# Patient Record
Sex: Female | Born: 1980 | Race: White | Hispanic: No | Marital: Married | Smoking: Former smoker
Health system: Southern US, Community
[De-identification: ages and names within clinical notes are randomized; demographics above are authoritative.]

## PROBLEM LIST (undated history)

## (undated) DIAGNOSIS — A159 Respiratory tuberculosis unspecified: Secondary | ICD-10-CM

## (undated) DIAGNOSIS — N7011 Chronic salpingitis: Secondary | ICD-10-CM

## (undated) DIAGNOSIS — Z8669 Personal history of other diseases of the nervous system and sense organs: Secondary | ICD-10-CM

## (undated) DIAGNOSIS — K5190 Ulcerative colitis, unspecified, without complications: Secondary | ICD-10-CM

## (undated) DIAGNOSIS — IMO0002 Reserved for concepts with insufficient information to code with codable children: Secondary | ICD-10-CM

## (undated) DIAGNOSIS — R519 Headache, unspecified: Secondary | ICD-10-CM

## (undated) DIAGNOSIS — E109 Type 1 diabetes mellitus without complications: Secondary | ICD-10-CM

## (undated) DIAGNOSIS — R87619 Unspecified abnormal cytological findings in specimens from cervix uteri: Secondary | ICD-10-CM

## (undated) HISTORY — DX: Ulcerative colitis, unspecified, without complications: K51.90

## (undated) HISTORY — DX: Headache, unspecified: R51.9

## (undated) HISTORY — PX: WISDOM TOOTH EXTRACTION: SHX21

## (undated) HISTORY — DX: Unspecified abnormal cytological findings in specimens from cervix uteri: R87.619

## (undated) HISTORY — DX: Personal history of other diseases of the nervous system and sense organs: Z86.69

## (undated) HISTORY — DX: Reserved for concepts with insufficient information to code with codable children: IMO0002

---
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
## 2009-09-05 ENCOUNTER — Other Ambulatory Visit: Admission: RE | Admit: 2009-09-05 | Discharge: 2009-09-05 | Payer: Self-pay | Admitting: Internal Medicine

## 2010-09-14 ENCOUNTER — Other Ambulatory Visit (HOSPITAL_COMMUNITY)
Admission: RE | Admit: 2010-09-14 | Discharge: 2010-09-14 | Disposition: A | Discharge status: Home / Self Care | Payer: BC Managed Care – PPO | Source: Ambulatory Visit | Attending: Internal Medicine | Admitting: Internal Medicine

## 2010-09-14 DIAGNOSIS — Z01419 Encounter for gynecological examination (general) (routine) without abnormal findings: Secondary | ICD-10-CM | POA: Insufficient documentation

## 2011-09-14 ENCOUNTER — Other Ambulatory Visit (HOSPITAL_COMMUNITY)
Admission: RE | Admit: 2011-09-14 | Discharge: 2011-09-14 | Disposition: A | Discharge status: Home / Self Care | Payer: BC Managed Care – PPO | Source: Ambulatory Visit | Attending: Internal Medicine | Admitting: Internal Medicine

## 2011-09-14 DIAGNOSIS — Z01419 Encounter for gynecological examination (general) (routine) without abnormal findings: Secondary | ICD-10-CM | POA: Insufficient documentation

## 2011-10-30 ENCOUNTER — Telehealth: Payer: Self-pay | Admitting: Obstetrics and Gynecology

## 2011-10-30 ENCOUNTER — Ambulatory Visit (INDEPENDENT_AMBULATORY_CARE_PROVIDER_SITE_OTHER): Payer: BC Managed Care – PPO | Admitting: Obstetrics and Gynecology

## 2011-10-30 ENCOUNTER — Encounter: Payer: Self-pay | Admitting: Obstetrics and Gynecology

## 2011-10-30 VITALS — BP 92/60 | HR 68 | Wt 133.0 lb

## 2011-10-30 DIAGNOSIS — Z309 Encounter for contraceptive management, unspecified: Secondary | ICD-10-CM

## 2011-10-30 DIAGNOSIS — N926 Irregular menstruation, unspecified: Secondary | ICD-10-CM

## 2011-10-30 DIAGNOSIS — N898 Other specified noninflammatory disorders of vagina: Secondary | ICD-10-CM

## 2011-10-30 LAB — POCT WET PREP (WET MOUNT)
Trichomonas Wet Prep HPF POC: NEGATIVE
pH: 4.5

## 2011-10-30 LAB — POCT URINE PREGNANCY: Preg Test, Ur: NEGATIVE

## 2011-10-30 MED ORDER — ETONOGESTREL-ETHINYL ESTRADIOL 0.12-0.015 MG/24HR VA RING: VAGINAL_RING | VAGINAL | Status: DC

## 2011-10-30 NOTE — Progress Notes (Signed)
Vaginal discharge: brownthick mucoid Itching / Burning: no Fever: no  Symptoms have been present for 2 weeks. Has used over-the-counter treatment: no Associated symptoms:  Pelvic pain: no       Dyspareunia: no     Odor:  no  History of STD:  no history of PID, STD's STD screen:declined

## 2011-10-30 NOTE — Progress Notes (Signed)
30 YO complains of a 2 week vaginal discharge that is "dark" since her last period.  Has not missed/late pills, no recent antibiotics or vomiting/diarrhea.  With period, flow x 3 days with tampon change twice a day.  Cramps 6/10 but doesn't require analgesia.  Discharge does require a protection. Has always had irreglar bleeding with oral contraceptives-even the previous pills that allowed a period each month.  (has been on Zovia, kariva   O: Pelvic: EGBUS-wnl;   vagina-beige discharge; cervix-no lesions/anterior, uterus-normal size and no tenderness, adnexae-no masses or tenderness  Wet prep-negative UPT-negative  A: Unscheduled bleeding on BCPS   P: Patient given option of a higher dose pill, Nuva Ring/Patch, or pelvic ultrasound with TSH evaluation.      She will try Nuva Ring for now-reviewed and demonstrated use;  Brochure given with sample       RTO-as scheduled or prn  Jaiyon Wander, PA-C

## 2012-08-14 ENCOUNTER — Inpatient Hospital Stay (HOSPITAL_COMMUNITY)
Admission: AD | Admit: 2012-08-14 | Discharge: 2012-08-14 | Disposition: A | Discharge status: Home / Self Care | Payer: BC Managed Care – PPO | Source: Ambulatory Visit | Attending: Obstetrics and Gynecology | Admitting: Obstetrics and Gynecology

## 2012-08-14 ENCOUNTER — Encounter (HOSPITAL_COMMUNITY): Payer: Self-pay | Admitting: *Deleted

## 2012-08-14 ENCOUNTER — Inpatient Hospital Stay (HOSPITAL_COMMUNITY): Payer: BC Managed Care – PPO

## 2012-08-14 DIAGNOSIS — R109 Unspecified abdominal pain: Secondary | ICD-10-CM | POA: Insufficient documentation

## 2012-08-14 DIAGNOSIS — N949 Unspecified condition associated with female genital organs and menstrual cycle: Secondary | ICD-10-CM | POA: Insufficient documentation

## 2012-08-14 DIAGNOSIS — N83201 Unspecified ovarian cyst, right side: Secondary | ICD-10-CM

## 2012-08-14 DIAGNOSIS — N83209 Unspecified ovarian cyst, unspecified side: Secondary | ICD-10-CM | POA: Insufficient documentation

## 2012-08-14 DIAGNOSIS — N926 Irregular menstruation, unspecified: Secondary | ICD-10-CM | POA: Insufficient documentation

## 2012-08-14 LAB — WET PREP, GENITAL
Trich, Wet Prep: NONE SEEN
Yeast Wet Prep HPF POC: NONE SEEN

## 2012-08-14 LAB — CBC
MCV: 86.5 fL (ref 78.0–100.0)
Platelets: 250 10*3/uL (ref 150–400)
RBC: 4.36 MIL/uL (ref 3.87–5.11)
WBC: 8.6 10*3/uL (ref 4.0–10.5)

## 2012-08-14 LAB — URINALYSIS, ROUTINE W REFLEX MICROSCOPIC
Ketones, ur: NEGATIVE mg/dL
Leukocytes, UA: NEGATIVE
Nitrite: NEGATIVE
Protein, ur: NEGATIVE mg/dL
Urobilinogen, UA: 0.2 mg/dL (ref 0.0–1.0)

## 2012-08-14 MED ORDER — IBUPROFEN 800 MG PO TABS
800.0000 mg | ORAL_TABLET | Freq: Once | ORAL | Status: AC
  Administered 2012-08-14: 800 mg via ORAL
  Filled 2012-08-14: qty 1

## 2012-08-14 MED ORDER — HYDROMORPHONE HCL 2 MG PO TABS
1.0000 mg | ORAL_TABLET | Freq: Once | ORAL | Status: AC
  Administered 2012-08-14: 1 mg via ORAL
  Filled 2012-08-14: qty 1

## 2012-08-14 MED ORDER — KETOROLAC TROMETHAMINE 60 MG/2ML IM SOLN
60.0000 mg | Freq: Once | INTRAMUSCULAR | Status: DC
  Filled 2012-08-14: qty 2

## 2012-08-14 MED ORDER — OXYCODONE-ACETAMINOPHEN 2.5-325 MG PO TABS: 1.0000 | ORAL_TABLET | ORAL | Status: DC | PRN

## 2012-08-14 NOTE — MAU Note (Signed)
Pt reports she was seen in ER over the weekend due to rt lower abd, right flank pain. Dx with hemorrhagic cyst and . Now having worsening pain and low grade fever.

## 2012-08-14 NOTE — MAU Provider Note (Signed)
History     CSN: [id number]  Arrival date and time: 08/14/12 4098   None     Chief Complaint  Patient presents with  . Abdominal Pain  . Flank Pain   HPI  Tammy Pollard is a 32 y.o. G0P0 who presents today with RLQ and pelvic pain. She has also had a temp of 100.0 at home. No tylenol since last night. Worked all day with only ibuprofen for pain, but then tonight the pain became much worse. She denies any vaginal bleeding. She was seen in the ED in [facility], and was told she had a hemorrhagic cyst, and ? "some fluid on the tube".   She has ulcerative colitis and has had her large intestine and appendix removed.  Past Medical History  Diagnosis Date  . Colitis, ulcerative   . Hx of migraines   . Abnormal Pap smear     CIN-I    Past Surgical History  Procedure Laterality Date  . Colectomy  1997    ulceratrive colitis  . Wisdom tooth extraction      Family History  Problem Relation Age of Onset  . Heart attack Paternal Grandfather   . Cancer Maternal Grandfather     History  Substance Use Topics  . Smoking status: Current Some Day Smoker  . Smokeless tobacco: Not on file     Comment: < PACK A WEEK  . Alcohol Use: Yes     Comment: OCCASIONAL    Allergies:  Allergies  Allergen Reactions  . Sulfa Antibiotics Rash    Prescriptions prior to admission  Medication Sig Dispense Refill  . ibuprofen (ADVIL,MOTRIN) 600 MG tablet Take 600 mg by mouth every 6 (six) hours as needed for pain.      . Multiple Vitamin (MULTIVITAMIN WITH MINERALS) TABS Take 1 tablet by mouth daily.      . Omega-3 Fatty Acids (FISH OIL PO) Take 1 capsule by mouth daily.      . ondansetron (ZOFRAN-ODT) 4 MG disintegrating tablet Take 4 mg by mouth every 8 (eight) hours as needed for nausea.      Marland Kitchen oxycodone-acetaminophen (PERCOCET) 2.5-325 MG per tablet Take 1 tablet by mouth every 4 (four) hours as needed for pain.        Review of Systems  Constitutional: Positive for fever and chills.  Eyes:  Negative for blurred vision.  Respiratory: Negative for shortness of breath.   Cardiovascular: Negative for chest pain.  Gastrointestinal: Positive for nausea, abdominal pain and diarrhea. Negative for vomiting and constipation.  Genitourinary: Negative for dysuria, urgency and frequency.  Musculoskeletal: Negative for myalgias.  Neurological: Negative for dizziness and headaches.   Physical Exam   Blood pressure 138/95, pulse 93, temperature 99.9 F (37.7 C), temperature source Oral, resp. rate 18, height 5\' 6"  (1.676 m), weight 61.689 kg (136 lb), last menstrual period 06/01/2012, SpO2 100.00%.  Physical Exam  Nursing note and vitals reviewed. Constitutional: She is oriented to person, place, and time. She appears well-developed and well-nourished. No distress.  Cardiovascular: Normal rate.   Respiratory: Effort normal.  GI: Soft. She exhibits no distension. There is no tenderness.  Genitourinary:   External: no lesion Vagina: small amount of white discharge Cervix: pink, smooth, no CMT Uterus: AV, NSSC Adnexa, NT r larger than l.   Neurological: She is alert and oriented to person, place, and time.  Skin: Skin is warm and dry.  Psychiatric: She has a normal mood and affect.    MAU Course  Procedures  Results for orders placed during the hospital encounter of 08/14/12 (from the past 24 hour(s))  URINALYSIS, ROUTINE W REFLEX MICROSCOPIC     Status: None   Collection Time    08/14/12  6:20 AM      Result Value Range   Color, Urine YELLOW  YELLOW   APPearance CLEAR  CLEAR   Specific Gravity, Urine 1.025  1.005 - 1.030   pH 5.5  5.0 - 8.0   Glucose, UA NEGATIVE  NEGATIVE mg/dL   Hgb urine dipstick NEGATIVE  NEGATIVE   Bilirubin Urine NEGATIVE  NEGATIVE   Ketones, ur NEGATIVE  NEGATIVE mg/dL   Protein, ur NEGATIVE  NEGATIVE mg/dL   Urobilinogen, UA 0.2  0.0 - 1.0 mg/dL   Nitrite NEGATIVE  NEGATIVE   Leukocytes, UA NEGATIVE  NEGATIVE  POCT PREGNANCY, URINE     Status:  None   Collection Time    08/14/12  6:29 AM      Result Value Range   Preg Test, Ur NEGATIVE  NEGATIVE  WET PREP, GENITAL     Status: Abnormal   Collection Time    08/14/12  6:50 AM      Result Value Range   Yeast Wet Prep HPF POC NONE SEEN  NONE SEEN   Trich, Wet Prep NONE SEEN  NONE SEEN   Clue Cells Wet Prep HPF POC NONE SEEN  NONE SEEN   WBC, Wet Prep HPF POC FEW (*) NONE SEEN  CBC     Status: None   Collection Time    08/14/12  7:01 AM      Result Value Range   WBC 8.6  4.0 - 10.5 K/uL   RBC 4.36  3.87 - 5.11 MIL/uL   Hemoglobin 13.0  12.0 - 15.0 g/dL   HCT 40.9  81.1 - 91.4 %   MCV 86.5  78.0 - 100.0 fL   MCH 29.8  26.0 - 34.0 pg   MCHC 34.5  30.0 - 36.0 g/dL   RDW 78.2  95.6 - 21.3 %   Platelets 250  150 - 400 K/uL    Assessment and Plan  0800 Care turned over to [facility], CNM  Tawnya Crook 08/14/2012, 7:50 AM   US Transvaginal Non-ob  08/14/2012   *RADIOLOGY REPORT*  Clinical Data: Hemorrhagic cyst with more pain and fever today. LMP 06/01/2012  TRANSABDOMINAL AND TRANSVAGINAL ULTRASOUND OF PELVIS Technique:  Both transabdominal and transvaginal ultrasound examinations of the pelvis were performed. Transabdominal technique was performed for global imaging of the pelvis including uterus, ovaries, adnexal regions, and pelvic cul-de-sac.  It was necessary to proceed with endovaginal exam following the transabdominal exam to visualize the myometrium, endometrium and adnexa.  Comparison:  None  Findings:  Uterus: Is retroverted and retroflexed and demonstrates a sagittal length of 6.9 cm, depth of 3.2 cm and width of 4.8 cm.  A homogeneous myometrium is seen  Endometrium: Is thin and echogenic with a width of 3 mm.  No areas of focal thickening or heterogeneity are noted  Right ovary:  Measures 4.2 x 3.0 x 3.7 cm and contains a complex cystic lesion which is avascular measuring 3.5 x 2.6 x 3.2 cm. This has an appearance sonographically which is most suspicious  for a hemorrhagic cyst.  Left ovary: Has a normal appearance measuring 2.7 x 1.3 x 1.9 cm  Other findings: A trace of simple free fluid is noted in the cul-de- sac.  No separate adnexal masses are seen  IMPRESSION:  Complex right ovarian mass with sonographic features most suspicious for a hemorrhagic cyst. Given the history of fever, although rare, in the appropriate clinical setting an isolated intraovarian abscess can have a similar appearance. This is felt less likely given the lack of substantial perilesional flow in the normal surrounding ovarian stroma. Follow-up can be performed to the immediate postsecretory phase of the cycle following the next complete cycle to assess for expected resolution/ evolution of a hemorrhagic cyst.   Original Report Authenticated By: Rhodia Albright, M.D.   US Pelvis Complete  08/14/2012   *RADIOLOGY REPORT*  Clinical Data: Hemorrhagic cyst with more pain and fever today. LMP 06/01/2012  TRANSABDOMINAL AND TRANSVAGINAL ULTRASOUND OF PELVIS Technique:  Both transabdominal and transvaginal ultrasound examinations of the pelvis were performed. Transabdominal technique was performed for global imaging of the pelvis including uterus, ovaries, adnexal regions, and pelvic cul-de-sac.  It was necessary to proceed with endovaginal exam following the transabdominal exam to visualize the myometrium, endometrium and adnexa.  Comparison:  None  Findings:  Uterus: Is retroverted and retroflexed and demonstrates a sagittal length of 6.9 cm, depth of 3.2 cm and width of 4.8 cm.  A homogeneous myometrium is seen  Endometrium: Is thin and echogenic with a width of 3 mm.  No areas of focal thickening or heterogeneity are noted  Right ovary:  Measures 4.2 x 3.0 x 3.7 cm and contains a complex cystic lesion which is avascular measuring 3.5 x 2.6 x 3.2 cm. This has an appearance sonographically which is most suspicious for a hemorrhagic cyst.  Left ovary: Has a normal appearance measuring 2.7 x 1.3  x 1.9 cm  Other findings: A trace of simple free fluid is noted in the cul-de- sac.  No separate adnexal masses are seen  IMPRESSION: Complex right ovarian mass with sonographic features most suspicious for a hemorrhagic cyst. Given the history of fever, although rare, in the appropriate clinical setting an isolated intraovarian abscess can have a similar appearance. This is felt less likely given the lack of substantial perilesional flow in the normal surrounding ovarian stroma. Follow-up can be performed to the immediate postsecretory phase of the cycle following the next complete cycle to assess for expected resolution/ evolution of a hemorrhagic cyst.   Original Report Authenticated By: Rhodia Albright, M.D.   A/P:  1. Ovarian cyst, right   Hemorrhagic cyst - per u/s report f/u should be performed in the immediate postsecretory phase of the next menstrual cycle, but pt has extremely irregular periods, so difficult to estimate cycle. Plan for f/u in 4-6 weeks in [facility], will make a plan for repeat imaging from there if needed. Rev'd precautions. Pain meds as below.     Medication List    TAKE these medications       FISH OIL PO  Take 1 capsule by mouth daily.     ibuprofen 600 MG tablet  Commonly known as:  ADVIL,MOTRIN  Take 600 mg by mouth every 6 (six) hours as needed for pain.     multivitamin with minerals Tabs  Take 1 tablet by mouth daily.     ondansetron 4 MG disintegrating tablet  Commonly known as:  ZOFRAN-ODT  Take 4 mg by mouth every 8 (eight) hours as needed for nausea.     oxycodone-acetaminophen 2.5-325 MG per tablet  Commonly known as:  PERCOCET  Take 1-2 tablets by mouth every 4 (four) hours as needed for pain.  Follow-up Information   Follow up with [facility] In 5 weeks. (someone will call to schedule appointment)    Contact information:   [address] [place] [address] [phone]

## 2012-08-15 LAB — GC/CHLAMYDIA PROBE AMP: CT Probe RNA: NEGATIVE

## 2012-08-20 NOTE — MAU Provider Note (Signed)
Attestation of Attending Supervision of Advanced Practitioner: Evaluation and management procedures were performed by the PA/NP/CNM/OB Fellow under my supervision/collaboration. Chart reviewed and agree with management and plan.  Branston Halsted V 08/20/2012 12:45 PM    

## 2012-09-15 ENCOUNTER — Other Ambulatory Visit: Payer: Self-pay

## 2012-09-15 ENCOUNTER — Other Ambulatory Visit (HOSPITAL_COMMUNITY)
Admission: RE | Admit: 2012-09-15 | Discharge: 2012-09-15 | Disposition: A | Discharge status: Home / Self Care | Payer: BC Managed Care – PPO | Source: Ambulatory Visit | Attending: Internal Medicine | Admitting: Internal Medicine

## 2012-09-15 DIAGNOSIS — Z01419 Encounter for gynecological examination (general) (routine) without abnormal findings: Secondary | ICD-10-CM | POA: Insufficient documentation

## 2012-09-22 ENCOUNTER — Encounter: Payer: BC Managed Care – PPO | Admitting: Obstetrics & Gynecology

## 2012-10-16 ENCOUNTER — Other Ambulatory Visit: Payer: Self-pay | Admitting: Internal Medicine

## 2012-10-16 DIAGNOSIS — N83209 Unspecified ovarian cyst, unspecified side: Secondary | ICD-10-CM

## 2012-10-16 DIAGNOSIS — R1031 Right lower quadrant pain: Secondary | ICD-10-CM

## 2012-10-30 ENCOUNTER — Ambulatory Visit
Admission: RE | Admit: 2012-10-30 | Discharge: 2012-10-30 | Disposition: A | Discharge status: Home / Self Care | Payer: BC Managed Care – PPO | Source: Ambulatory Visit | Attending: Internal Medicine | Admitting: Internal Medicine

## 2012-10-30 DIAGNOSIS — R1031 Right lower quadrant pain: Secondary | ICD-10-CM

## 2012-10-30 DIAGNOSIS — N83209 Unspecified ovarian cyst, unspecified side: Secondary | ICD-10-CM

## 2013-01-29 ENCOUNTER — Other Ambulatory Visit: Payer: Self-pay

## 2014-10-13 ENCOUNTER — Other Ambulatory Visit (HOSPITAL_COMMUNITY)
Admission: RE | Admit: 2014-10-13 | Discharge: 2014-10-13 | Disposition: A | Discharge status: Home / Self Care | Payer: BC Managed Care – PPO | Source: Ambulatory Visit | Attending: Internal Medicine | Admitting: Internal Medicine

## 2014-10-13 ENCOUNTER — Other Ambulatory Visit: Payer: Self-pay | Admitting: Registered Nurse

## 2014-10-13 DIAGNOSIS — Z01419 Encounter for gynecological examination (general) (routine) without abnormal findings: Secondary | ICD-10-CM | POA: Diagnosis present

## 2014-10-15 LAB — CYTOLOGY - PAP

## 2015-08-23 ENCOUNTER — Other Ambulatory Visit (HOSPITAL_COMMUNITY): Payer: Self-pay | Admitting: Obstetrics and Gynecology

## 2015-08-23 DIAGNOSIS — N979 Female infertility, unspecified: Secondary | ICD-10-CM

## 2015-08-29 ENCOUNTER — Ambulatory Visit (HOSPITAL_COMMUNITY)
Admission: RE | Admit: 2015-08-29 | Discharge: 2015-08-29 | Disposition: A | Discharge status: Home / Self Care | Payer: BC Managed Care – PPO | Source: Ambulatory Visit | Attending: Obstetrics and Gynecology | Admitting: Obstetrics and Gynecology

## 2015-08-29 DIAGNOSIS — N979 Female infertility, unspecified: Secondary | ICD-10-CM | POA: Insufficient documentation

## 2015-08-29 IMAGING — RF DG HYSTEROGRAM
3 series · 3 of 3 positions shown · IV contrast (omnipaque)
Comparison: None.

FLUOROSCOPY TIME:  Fluoroscopy Time:  18 seconds

Number of Acquired Images:  3

CLINICAL DATA: Evaluate for infertility

EXAM:
HYSTEROSALPINGOGRAM
TECHNIQUE: Following cleansing of the cervix and vagina with Betadine solution,
a hysterosalpingogram was performed using a 5-French
hysterosalpingogram catheter and Omnipaque 300 contrast. The patient
tolerated the examination without difficulty.

[Series 1: run · 1 of 1 slices shown (1 of 3)]
[im 1/1]
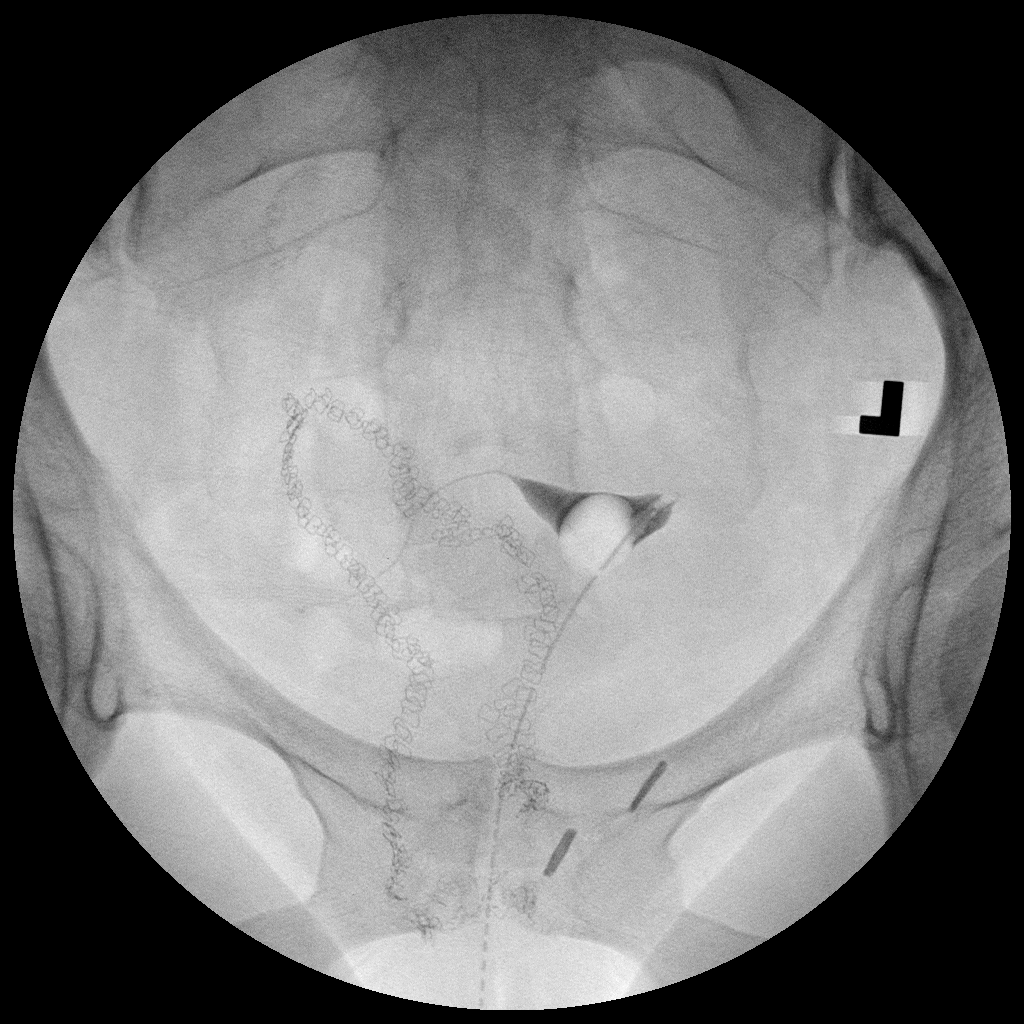

[Series 2: run · 1 of 1 slices shown (2 of 3)]
[im 1/1]
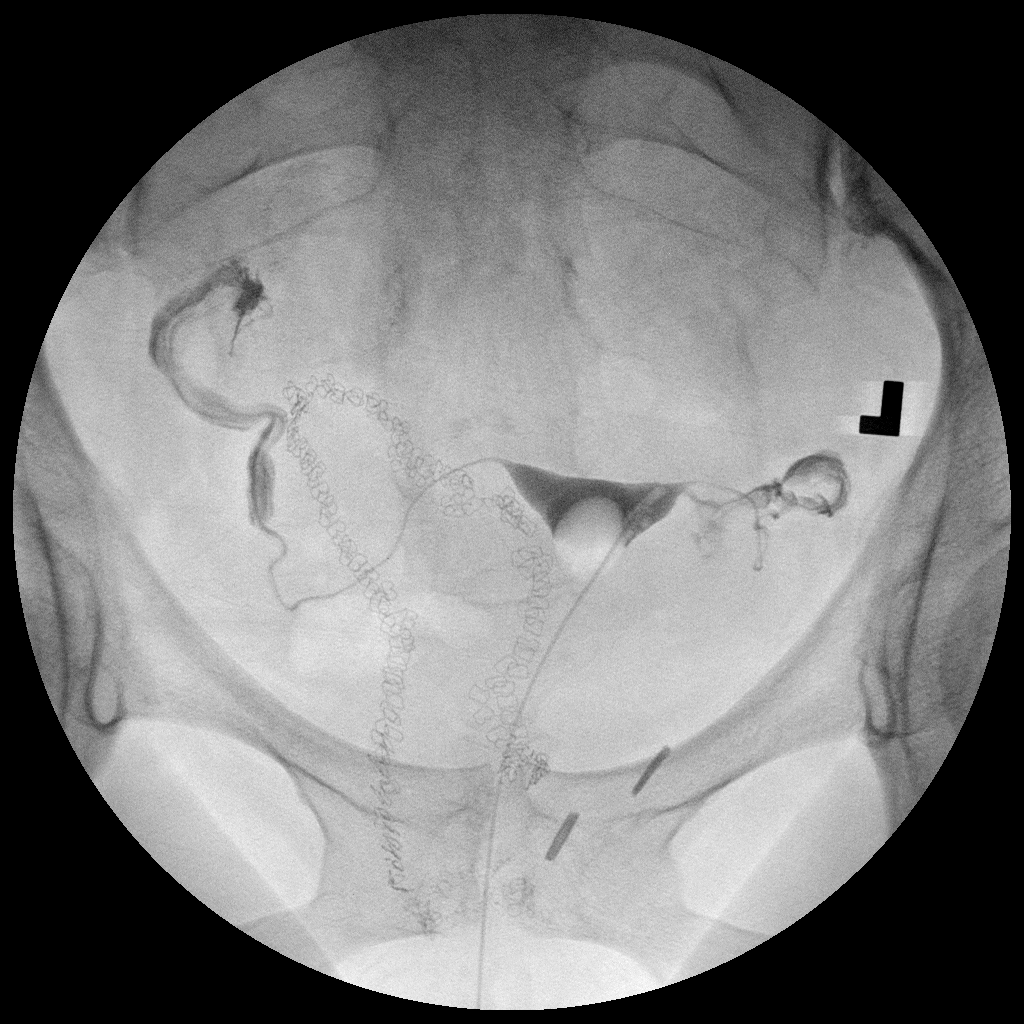

[Series 3: run · 1 of 1 slices shown (3 of 3)]
[im 1/1]
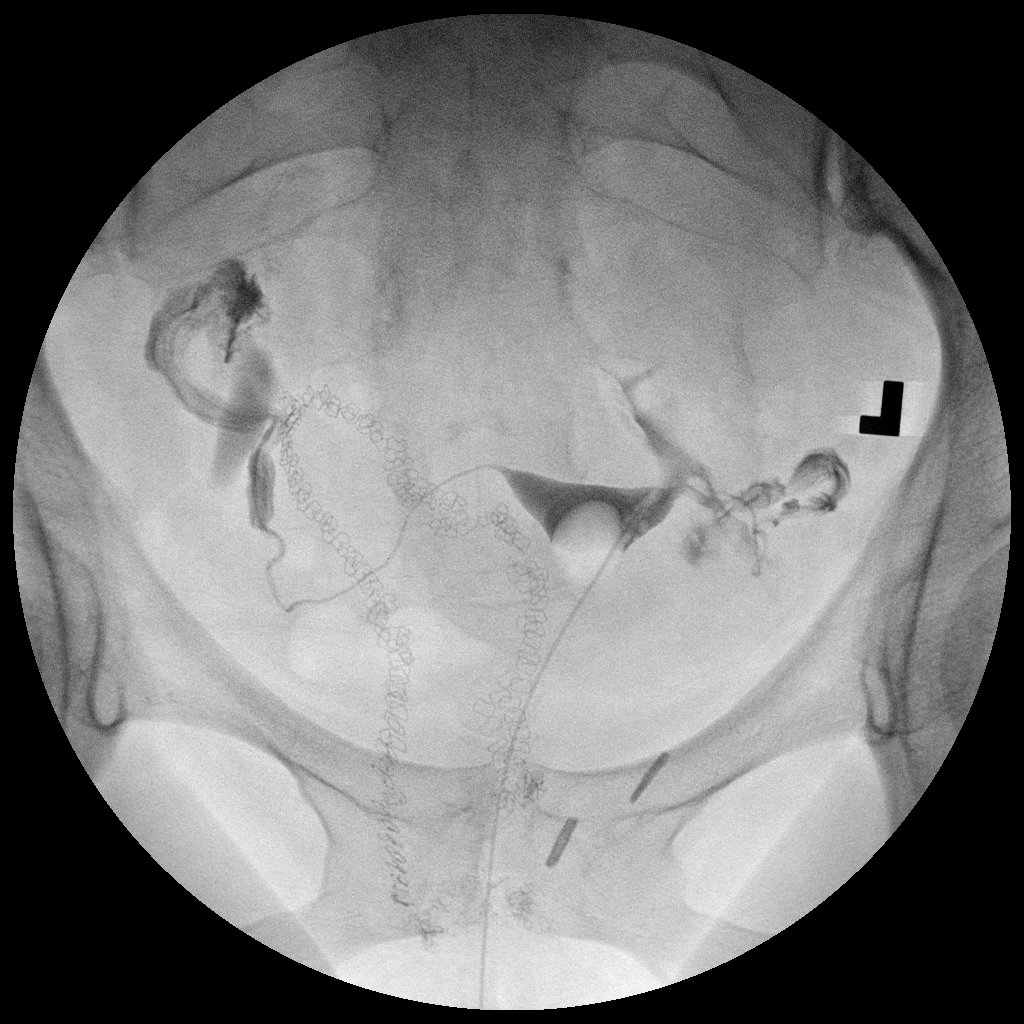

[3 of 3 positions shown; findings below may reference images not displayed]

FINDINGS: The endometrial cavity is normal in appearance and contour. No signs
of mullerian duct anomaly.

Opacification of both fallopian tubes is seen. Both tubes appear
normal. Intraperitoneal spill of contrast from both fallopian tubes
is demonstrated.
IMPRESSION: Normal study. Both fallopian tubes are patent.

## 2015-08-29 MED ORDER — IOPAMIDOL (ISOVUE-300) INJECTION 61%
30.0000 mL | Freq: Once | INTRAVENOUS | Status: AC | PRN
  Administered 2015-08-29: 4 mL

## 2016-03-26 NOTE — L&D Delivery Note (Signed)
Delivery Note C/C/+2 at 1235, to birth tub for active second stage. FHT Category 2 noted, deep variables to 60 BPM in HK pushing, patient returned to bed for side lying and recumbent positioning with active guidance pushing. FHT remained category 2, reassuring with good variability and return to baseline 130.  Using nitrous intermittently with good effect.   At 1:06 PM a viable female was delivered via Vaginal, Spontaneous Delivery (Presentation: OA to ROT, head delivered w/ CNM, shoulders and body assisted by FOB, good cry with stimulation and cord strip x 2; vigorous on mother's chest ).  APGAR: 8, 9; weight  pending.   Placenta status: S/C/I ,uterus firm after, bleed small .  Cord:  3VC with the following complications: CAN x 1, reduced on perineum.  Cord blood collected for typing  Anesthesia:  None Nitrous and hydrotherapy for pain control Episiotomy: None Lacerations: R Labial transected, L labial splay Suture Repair: 3.0 vicryl Est. Blood Loss (mL): 200  Mom to postpartum.  Baby to Couplet care / Skin to Skin.  Neta Mends, CNM 11/17/2016, 1:44 PM

## 2016-04-19 LAB — OB RESULTS CONSOLE HEPATITIS B SURFACE ANTIGEN: Hepatitis B Surface Ag: NEGATIVE

## 2016-04-19 LAB — OB RESULTS CONSOLE GC/CHLAMYDIA
Chlamydia: NEGATIVE
GC PROBE AMP, GENITAL: NEGATIVE

## 2016-04-19 LAB — OB RESULTS CONSOLE HIV ANTIBODY (ROUTINE TESTING): HIV: NONREACTIVE

## 2016-04-19 LAB — OB RESULTS CONSOLE RPR: RPR: NONREACTIVE

## 2016-04-19 LAB — OB RESULTS CONSOLE RUBELLA ANTIBODY, IGM: RUBELLA: IMMUNE

## 2016-09-10 ENCOUNTER — Telehealth: Payer: Self-pay | Admitting: Registered"

## 2016-09-10 NOTE — Telephone Encounter (Signed)
Pt is scheduled for participation in Gestational Diabetes class on June 27. Pt wanted to know what her carb goal should be. Pt states she is a Engineer, civil (consulting)nurse and understand carb counting and has also been researching GDM. RD confirmed that she knew what her BG goals are from her doctor and discussed a range of about 45 g cho per meal and 15-30 per snack, and eating with balanced meals. Advised pt to verify with checking BG to see if this works for her.

## 2016-09-19 ENCOUNTER — Ambulatory Visit: Payer: BC Managed Care – PPO | Admitting: Registered"

## 2016-10-24 LAB — OB RESULTS CONSOLE GBS: GBS: NEGATIVE

## 2016-11-17 ENCOUNTER — Inpatient Hospital Stay (HOSPITAL_COMMUNITY)
Admission: AD | Admit: 2016-11-17 | Discharge: 2016-11-19 | DRG: 775 | Disposition: A | Discharge status: Home / Self Care | Payer: BC Managed Care – PPO | Source: Ambulatory Visit | Attending: Obstetrics and Gynecology | Admitting: Obstetrics and Gynecology

## 2016-11-17 ENCOUNTER — Encounter (HOSPITAL_COMMUNITY): Payer: Self-pay

## 2016-11-17 DIAGNOSIS — O24420 Gestational diabetes mellitus in childbirth, diet controlled: Principal | ICD-10-CM | POA: Diagnosis present

## 2016-11-17 DIAGNOSIS — Z3A38 38 weeks gestation of pregnancy: Secondary | ICD-10-CM

## 2016-11-17 DIAGNOSIS — Z3493 Encounter for supervision of normal pregnancy, unspecified, third trimester: Secondary | ICD-10-CM | POA: Diagnosis present

## 2016-11-17 LAB — CBC
HCT: 36.4 % (ref 36.0–46.0)
HEMOGLOBIN: 12.8 g/dL (ref 12.0–15.0)
MCH: 29.5 pg (ref 26.0–34.0)
MCHC: 35.2 g/dL (ref 30.0–36.0)
MCV: 83.9 fL (ref 78.0–100.0)
Platelets: 307 10*3/uL (ref 150–400)
RBC: 4.34 MIL/uL (ref 3.87–5.11)
RDW: 13.7 % (ref 11.5–15.5)
WBC: 12.1 10*3/uL — ABNORMAL HIGH (ref 4.0–10.5)

## 2016-11-17 LAB — ABO/RH: ABO/RH(D): A POS

## 2016-11-17 LAB — GLUCOSE, CAPILLARY
GLUCOSE-CAPILLARY: 120 mg/dL — AB (ref 65–99)
GLUCOSE-CAPILLARY: 116 mg/dL — AB (ref 65–99)

## 2016-11-17 LAB — TYPE AND SCREEN
ABO/RH(D): A POS
ANTIBODY SCREEN: NEGATIVE

## 2016-11-17 LAB — RPR: RPR: NONREACTIVE

## 2016-11-17 MED ORDER — ONDANSETRON HCL 4 MG/2ML IJ SOLN: 4.0000 mg | INTRAMUSCULAR | Status: DC | PRN

## 2016-11-17 MED ORDER — SOD CITRATE-CITRIC ACID 500-334 MG/5ML PO SOLN: 30.0000 mL | ORAL | Status: DC | PRN

## 2016-11-17 MED ORDER — SENNOSIDES-DOCUSATE SODIUM 8.6-50 MG PO TABS
2.0000 | ORAL_TABLET | ORAL | Status: DC
  Filled 2016-11-17 (×2): qty 2

## 2016-11-17 MED ORDER — SIMETHICONE 80 MG PO CHEW: 80.0000 mg | CHEWABLE_TABLET | ORAL | Status: DC | PRN

## 2016-11-17 MED ORDER — OXYCODONE-ACETAMINOPHEN 5-325 MG PO TABS: 1.0000 | ORAL_TABLET | ORAL | Status: DC | PRN

## 2016-11-17 MED ORDER — ONDANSETRON HCL 4 MG PO TABS: 4.0000 mg | ORAL_TABLET | ORAL | Status: DC | PRN

## 2016-11-17 MED ORDER — FLEET ENEMA 7-19 GM/118ML RE ENEM: 1.0000 | ENEMA | RECTAL | Status: DC | PRN

## 2016-11-17 MED ORDER — PRENATAL MULTIVITAMIN CH
1.0000 | ORAL_TABLET | Freq: Every day | ORAL | Status: DC
  Filled 2016-11-17: qty 1

## 2016-11-17 MED ORDER — LACTATED RINGERS IV SOLN: 500.0000 mL | INTRAVENOUS | Status: DC | PRN

## 2016-11-17 MED ORDER — DIPHENHYDRAMINE HCL 25 MG PO CAPS: 25.0000 mg | ORAL_CAPSULE | Freq: Four times a day (QID) | ORAL | Status: DC | PRN

## 2016-11-17 MED ORDER — DIBUCAINE 1 % RE OINT: 1.0000 "application " | TOPICAL_OINTMENT | RECTAL | Status: DC | PRN

## 2016-11-17 MED ORDER — WITCH HAZEL-GLYCERIN EX PADS: 1.0000 "application " | MEDICATED_PAD | CUTANEOUS | Status: DC | PRN

## 2016-11-17 MED ORDER — TETANUS-DIPHTH-ACELL PERTUSSIS 5-2.5-18.5 LF-MCG/0.5 IM SUSP: 0.5000 mL | Freq: Once | INTRAMUSCULAR | Status: DC

## 2016-11-17 MED ORDER — FLEET ENEMA 7-19 GM/118ML RE ENEM: 1.0000 | ENEMA | Freq: Every day | RECTAL | Status: DC | PRN

## 2016-11-17 MED ORDER — OXYTOCIN BOLUS FROM INFUSION
500.0000 mL | Freq: Once | INTRAVENOUS | Status: AC
  Administered 2016-11-17: 500 mL via INTRAVENOUS

## 2016-11-17 MED ORDER — IBUPROFEN 600 MG PO TABS
600.0000 mg | ORAL_TABLET | Freq: Four times a day (QID) | ORAL | Status: DC
  Administered 2016-11-17 – 2016-11-19 (×8): 600 mg via ORAL
  Filled 2016-11-17 (×8): qty 1

## 2016-11-17 MED ORDER — ONDANSETRON HCL 4 MG/2ML IJ SOLN
4.0000 mg | Freq: Four times a day (QID) | INTRAMUSCULAR | Status: DC | PRN
  Administered 2016-11-17: 4 mg via INTRAVENOUS
  Filled 2016-11-17: qty 2

## 2016-11-17 MED ORDER — BENZOCAINE-MENTHOL 20-0.5 % EX AERO
1.0000 "application " | INHALATION_SPRAY | CUTANEOUS | Status: DC | PRN
  Administered 2016-11-17: 1 via TOPICAL
  Filled 2016-11-17: qty 56

## 2016-11-17 MED ORDER — BISACODYL 10 MG RE SUPP
10.0000 mg | Freq: Every day | RECTAL | Status: DC | PRN
Start: 2016-11-17 — End: 2016-11-19

## 2016-11-17 MED ORDER — ZOLPIDEM TARTRATE 5 MG PO TABS: 5.0000 mg | ORAL_TABLET | Freq: Every evening | ORAL | Status: DC | PRN

## 2016-11-17 MED ORDER — LACTATED RINGERS IV SOLN
INTRAVENOUS | Status: DC
  Administered 2016-11-17: 08:00:00 via INTRAVENOUS

## 2016-11-17 MED ORDER — OXYTOCIN 40 UNITS IN LACTATED RINGERS INFUSION - SIMPLE MED
2.5000 [IU]/h | INTRAVENOUS | Status: DC
  Filled 2016-11-17: qty 1000

## 2016-11-17 MED ORDER — OXYCODONE-ACETAMINOPHEN 5-325 MG PO TABS: 2.0000 | ORAL_TABLET | ORAL | Status: DC | PRN

## 2016-11-17 MED ORDER — ACETAMINOPHEN 325 MG PO TABS
650.0000 mg | ORAL_TABLET | ORAL | Status: DC | PRN
  Administered 2016-11-18 (×2): 650 mg via ORAL
  Filled 2016-11-17 (×2): qty 2

## 2016-11-17 MED ORDER — COCONUT OIL OIL: 1.0000 "application " | TOPICAL_OIL | Status: DC | PRN

## 2016-11-17 MED ORDER — ACETAMINOPHEN 325 MG PO TABS: 650.0000 mg | ORAL_TABLET | ORAL | Status: DC | PRN

## 2016-11-17 MED ORDER — LIDOCAINE HCL (PF) 1 % IJ SOLN
30.0000 mL | INTRAMUSCULAR | Status: AC | PRN
  Administered 2016-11-17: 30 mL via SUBCUTANEOUS
  Filled 2016-11-17: qty 30

## 2016-11-17 NOTE — MAU Note (Signed)
PT  SAYS SROM AT 0545- CLEAR FLUID .  VE LAST WEEK    1 CM .  GBS- NEG  .   DENIES HSV AND  MRSA

## 2016-11-17 NOTE — Progress Notes (Signed)
Pt requested a photo of the FHR tracing. Tracing was reactive and reassuring. Name and medical record number verified.

## 2016-11-17 NOTE — Progress Notes (Signed)
S: Doing well, coping w/ ctx, using nitrous at present, out of tub while water is changed, soiled by loose BM.   O: Vitals:   11/17/16 5409 11/17/16 0901 11/17/16 0914 11/17/16 1100  BP:  140/90    Pulse:  (!) 107    Resp:  18    Temp: 97.9 F (36.6 C)   (!) 97.3 F (36.3 C)  TempSrc: Oral   Axillary  Weight:   78.9 kg (174 lb)   Height:   5\' 6"  (1.676 m)      FHT:  FHR: 130 145 bpm, no audible decels UC:   regular, every 2-3 minutes, palp mod SVE:   Dilation: 7 Effacement (%): 90 Station: -1 Exam by:: D.Paul,CNM   A / P: Spontaneous labor, progressing normally  Fetal Wellbeing:  Category I Pain Control:  Nitrous Oxide and Water tub interchanged  Anticipated MOD:  NSVD  Continue expectant management  Neta Mends, CNM, MSN 11/17/2016, 12:14 PM

## 2016-11-17 NOTE — H&P (Signed)
OB ADMISSION/ HISTORY & PHYSICAL:  Admission Date: 11/17/2016  6:20 AM  Admit Diagnosis: Term pregnancy, SROM, early labor   Tammy Pollard is a 36 y.o. female presenting for LOFsince 0545, clearm painful ctx mosre intense since SROM. + FM, mild nausea, no emesis, no VB. Desires waterbirth, class completed  Prenatal History: G1P0   EDC : 11/25/2016, by Other Basis  Prenatal care at [facility] since [redacted] weeks gestation, primary Dr. Billy Coast, transfer to midwifery care at 34 wks for waterbirth option  Prenatal course complicated by GDM A1, mostly controlled, some abnormal values last week. Reports FBS 70 yesterday, 120 PP  Prenatal Labs: ABO, Rh:   A pos Antibody:  neg Rubella:   immune RPR:   NR HBsAg:   neg HIV:   neg GBS:   neg 1 hr Glucola : 182, 3GTT 86, 230, 236, 195 AFP1 WNL   Medical / Surgical History :  Past medical history:  Past Medical History:  Diagnosis Date  . Abnormal Pap smear    CIN-I  . Colitis, ulcerative (HCC)   . Hx of migraines      Past surgical history:  Past Surgical History:  Procedure Laterality Date  . COLECTOMY  1997   ulceratrive colitis  . WISDOM TOOTH EXTRACTION       Family History:  Family History  Problem Relation Age of Onset  . Heart attack Paternal Grandfather   . Cancer Maternal Grandfather      Social History:  reports that she has never smoked. She has never used smokeless tobacco. She reports that she drinks alcohol. She reports that she does not use drugs.   Allergies: Sulfa antibiotics    Current Medications at time of admission:  Prescriptions Prior to Admission  Medication Sig Dispense Refill Last Dose  . Multiple Vitamin (MULTIVITAMIN WITH MINERALS) TABS Take 1 tablet by mouth daily.   Past Week at Unknown time  . ibuprofen (ADVIL,MOTRIN) 600 MG tablet Take 600 mg by mouth every 6 (six) hours as needed for pain.   More than a month at Unknown time  . Omega-3 Fatty Acids (FISH OIL PO) Take 1  capsule by mouth daily.   More than a month at Unknown time  . ondansetron (ZOFRAN-ODT) 4 MG disintegrating tablet Take 4 mg by mouth every 8 (eight) hours as needed for nausea.   More than a month at Unknown time  . oxycodone-acetaminophen (PERCOCET) 2.5-325 MG per tablet Take 1-2 tablets by mouth every 4 (four) hours as needed for pain. 30 tablet 0 More than a month at Unknown time      Review of Systems: ROS As noted above  Physical Exam:  Dilation: 4 Effacement (%): 90 Station: -1 Exam by:: Renae Fickle, CNM  VSSAF  General: AAO x 3, NAD Heart:RRR Lungs:CTAB Abdomen:NT, gravid, S=D Extremities: no edema Genitalia / VE: as above, clear AF, vertex FHR: 140, mod var, + accels, no decels TOCO: q 3-4 min  Labs:     Recent Labs  11/17/16 0700  WBC 12.1*  HGB 12.8  HCT 36.4  PLT 307     Assessment:  36 y.o. G1P0 at [redacted]w[redacted]d GDM A1, stable, TWG 23 lbs  1. 1st stage of labor 2. FHR category 1 3. GBS neg 4. Desires hydrotherapy 5. Breastfeeding planned   Plan:  1. Admit to BS 2. Routine L&D orders, CBG q 2hrs x 2 if normal, otherwise co 3. Analgesia/anesthesia PRN, may use nitrous gas until L&D room available, hydrotherapy  as needed 4. Expectant management 5. Anticipate NSVB / waterbirth  Dr Juliene Pina notified of admission / plan of care   Neta Mends CNM, MSN 11/17/2016, 7:57 AM

## 2016-11-18 LAB — CBC
HCT: 33.6 % — ABNORMAL LOW (ref 36.0–46.0)
Hemoglobin: 11.7 g/dL — ABNORMAL LOW (ref 12.0–15.0)
MCH: 29.7 pg (ref 26.0–34.0)
MCHC: 34.8 g/dL (ref 30.0–36.0)
MCV: 85.3 fL (ref 78.0–100.0)
PLATELETS: 307 10*3/uL (ref 150–400)
RBC: 3.94 MIL/uL (ref 3.87–5.11)
RDW: 14 % (ref 11.5–15.5)
WBC: 12.1 10*3/uL — AB (ref 4.0–10.5)

## 2016-11-18 NOTE — Progress Notes (Signed)
PPD # 1 SVD Information for the patient's newborn:  Tammy Pollard, Tammy Pollard [[id number]]  female    breast feeding   Baby name: Addison  S:  Reports feeling well, very happy w/ birth experience             Tolerating po/ No nausea or vomiting             Bleeding is decreased             Pain controlled with ibuprofen (OTC)             Up ad lib / ambulatory / voiding without difficulties        O:  A & O x 3, in no apparent distress              VS:  Vitals:   11/17/16 1430 11/17/16 1515 11/17/16 1630 11/18/16 0555  BP: 131/66 120/72 120/69 114/73  Pulse: 87 63 67 76  Resp: 16 20 18 18   Temp:  98.1 F (36.7 C) 98.1 F (36.7 C) 97.9 F (36.6 C)  TempSrc:    Oral  Weight:      Height:        LABS:  Recent Labs  11/17/16 0700 11/18/16 0521  WBC 12.1* 12.1*  HGB 12.8 11.7*  HCT 36.4 33.6*  PLT 307 307    Blood type: --/--/A POS, A POS (08/25 0700)  Rubella: Immune (01/25 0000)   I&O: I/O last 3 completed shifts: In: -  Out: 200 [Blood:200]          No intake/output data recorded.  Lungs: Clear and unlabored  Heart: regular rate and rhythm / no murmurs  Abdomen: soft, non-tender, non-distended             Fundus: firm, non-tender, U-1  Perineum: repair intact, mild edema  Lochia: small  Extremities: no edema, no calf pain or tenderness    A/P: PPD # 1 35 y.o., G1P1001   Principal Problem:   Postpartum care following vaginal delivery 8/25 Active Problems:   Obstetrical laceration - R labia minora, and L perilabial splay   Normal delivery   Doing well - stable status  Breastfeeding support.   Routine post partum orders  Anticipate discharge tomorrow    Neta Mends, MSN, CNM 11/18/2016, 10:32 AM

## 2016-11-18 NOTE — Lactation Note (Signed)
This note was copied from a baby's chart. Lactation Consultation Note  Patient Name: Tammy Pollard ELFYB'O Date: 11/18/2016 Reason for consult: Follow-up assessment   P1, Baby has been sleepy and spitty. Reviewed hand expression and mother is able to easily express drops of colostrum. Assisted w/ latching in football hand cross cradle.  Positioned pillows. Baby latches and then falls asleep.  With breast compression, baby intermittently sucked w/ swallows observed. Worked w/ mother on sustaining a deep latch and reviewed basics. Mom encouraged to feed baby 8-12 times/24 hours and with feeding cues.  Mom made aware of O/P services, breastfeeding support groups, community resources, and our phone # for post-discharge questions. Advised parents to undress baby and check diaper for feedings if sleepy.     Maternal Data Has patient been taught Hand Expression?: Yes Does the patient have breastfeeding experience prior to this delivery?: No  Feeding Feeding Type: Breast Fed Length of feed: 15 min  LATCH Score Latch: Grasps breast easily, tongue down, lips flanged, rhythmical sucking.  Audible Swallowing: A few with stimulation  Type of Nipple: Everted at rest and after stimulation  Comfort (Breast/Nipple): Soft / non-tender  Hold (Positioning): Assistance needed to correctly position infant at breast and maintain latch.  LATCH Score: 8  Interventions    Lactation Tools Discussed/Used     Consult Status Consult Status: Follow-up Date: 11/19/16 Follow-up type: In-patient    Dahlia Byes [facility] 11/18/2016, 10:49 AM

## 2016-11-19 MED ORDER — IBUPROFEN 600 MG PO TABS: 600.0000 mg | ORAL_TABLET | Freq: Four times a day (QID) | ORAL | 0 refills | Status: DC

## 2016-11-19 NOTE — Progress Notes (Signed)
PPD 2 SVD  S:  Reports feeling well - some perineal soreness only             Tolerating po/ No nausea or vomiting             Bleeding is light             Pain controlled with Motrin and Tylenol             Up ad lib / ambulatory / voiding QS  Newborn breast feeding  / female  O: VS: BP (!) 112/58 (BP Location: Left Arm)   Pulse 69   Temp 98.1 F (36.7 C) (Oral)   Resp 18   Ht 5\' 6"  (1.676 m)   Wt 78.9 kg (174 lb)   SpO2 99%   Breastfeeding? Unknown   BMI 28.08 kg/m    LABS:              Recent Labs  11/17/16 0700 11/18/16 0521  WBC 12.1* 12.1*  HGB 12.8 11.7*  PLT 307 307               Blood type: --/--/A POS, A POS (08/25 0700)  Rubella: Immune (01/25 0000)                    Physical Exam:             Alert and oriented X3  Abdomen: soft, non-tender, non-distended              Fundus: firm, non-tender, U-1  Perineum: no edema  Lochia: light  Extremities: trace edema, no calf pain or tenderness  A: PPD # 2 SVD with labial repair   Doing well - stable status  P: Routine post partum orders  DC home - WOB booklet - instructions reveiwed  Marlinda Mike CNM, MSN, [facility] 11/19/2016, 8:39 AM

## 2016-11-19 NOTE — Discharge Summary (Signed)
Obstetric Discharge Summary Reason for Admission: onset of labor Prenatal Procedures: none Intrapartum Procedures: spontaneous vaginal delivery Postpartum Procedures: none Complications-Operative and Postpartum: none Hemoglobin  Date Value Ref Range Status  11/18/2016 11.7 (L) 12.0 - 15.0 g/dL Final   HCT  Date Value Ref Range Status  11/18/2016 33.6 (L) 36.0 - 46.0 % Final    Physical Exam:  General: alert, cooperative and no distress Lochia: appropriate Uterine Fundus: firm Perineum - intact  / labial repair DVT Evaluation: No evidence of DVT seen on physical exam.  Discharge Diagnoses: Term Pregnancy-delivered  Discharge Information: Date: 11/19/2016 Activity: pelvic rest Diet: routine Medications: PNV, Ibuprofen and Tylenol Condition: stable Instructions: refer to practice specific booklet Discharge to: home Follow-up Information    Tammy Mackie, MD. Schedule an appointment as soon as possible for a visit in 6 week(s).   Specialty:  Obstetrics and Gynecology Why:  sugar test 6-12 weeks postpartum Contact information: [address] [place] [address] [phone]           Newborn Data: Live born female  Birth Weight: 7 lb 8.8 oz (3425 g) APGAR: 8, 9  Home with mother.  Tammy Pollard 11/19/2016, 9:06 AM

## 2016-11-19 NOTE — Lactation Note (Signed)
This note was copied from a baby's chart. Lactation Consultation Note  Patient Name: Tammy Pollard [id number] Date: 11/19/2016 Reason for consult: Follow-up assessment  Follow up visit at 43 hours of age.  Mom reports waking baby some for feedings with 10 feedings past 24 hours with good output.  Mom reports breasts are filling some.  Mom plans to continue waking baby as needed for 10-12 feedings per 24 as discussed by mom with provider.  LC encouraged mom to watch for swallows and good latching.  Mom to keep baby active during feedings.  Mom attempted latch at this time, baby is too sleepy.   Discussed milk transitioning to larger volume, engorgement care discussed.  Encouraged frequent feedings. Mom to soften breast as needed prior to latch.       Maternal Data Has patient been taught Hand Expression?: Yes  Feeding Feeding Type: Breast Fed Length of feed: 15 min  LATCH Score                   Interventions    Lactation Tools Discussed/Used     Consult Status Consult Status: Complete    Franz Dell 11/19/2016, 8:53 AM

## 2016-12-11 ENCOUNTER — Telehealth (HOSPITAL_COMMUNITY): Payer: Self-pay | Admitting: Lactation Services

## 2016-12-11 NOTE — Telephone Encounter (Signed)
Returned mom's call with questions. Mom reports infant cluster fed last night and then received a bottle of EBM and then took a long break. She is about 65 weeks old and is gaining weight well. Mom asked if she should be pumping in between feedings, enc mom to follow infant lead. Mom reports she had mastitis on Friday and has noticed she is not feeling as full. Mom reports infant is not wanting to nurse on the effected side. Enc mom to pump the effected side if infant not willing to latch to protect the supply. Mom asking a good time to pump. Mom is afraid she is going to take away infant food, she does have milk stored if needed. All mom's questions answered re pumping and feeding infant. Enc mom to call back with any questions/concerns prn.

## 2017-01-24 ENCOUNTER — Telehealth (HOSPITAL_COMMUNITY): Payer: Self-pay | Admitting: Lactation Services

## 2017-01-24 NOTE — Telephone Encounter (Signed)
Mom called with concerns that her milk supply has decreased over the last few days and infant has been sleeping longer between feeds. Mom reports she and infant had a cold last week, mom only took Tylenol during the cold. Mom reports she feel much less full this week as compared to previously. Mom reports she is trying to awaken infant to feed more often. Infant generally will only eat on one side, she is offering the second side.   Mom reports [facility] is [facility] about 3 x a day and is getting 1/2-1 oz, which is less that she was getting. Mom reports she pumped x 2 yesterday 1-2 hours post BF for 15-20 minutes with an Avent pump and getting 1/2-1 oz. Mom has not generally been pumping up until the last few days.   Mom reports they just started giving infant a bottle by dad and have tried Tommie Tippee, Dr. Theora GianottiBrown's, MAM and Avent bottles, infant prefers Avent and is inconsistent with whether she will take it or not.   Mom is returning to work in 2 weeks and is concerned infant is not taking bottles well. Enc mom to keep trying and if infant wont take to try a sippy cup or open top cup.   Reviewed pumping a few times a day about the same time post BF to stimulate milk production. Discussed trying power pumping.   [facility] Enc mom to call back with further questions/concerns as needed. Mom reports all questions answered.

## 2019-01-25 DIAGNOSIS — U071 COVID-19: Secondary | ICD-10-CM

## 2019-01-25 HISTORY — DX: COVID-19: U07.1

## 2019-11-20 ENCOUNTER — Other Ambulatory Visit: Payer: Self-pay | Admitting: Obstetrics and Gynecology

## 2019-12-21 ENCOUNTER — Other Ambulatory Visit (HOSPITAL_COMMUNITY): Payer: BC Managed Care – PPO

## 2019-12-22 ENCOUNTER — Encounter (HOSPITAL_BASED_OUTPATIENT_CLINIC_OR_DEPARTMENT_OTHER): Payer: Self-pay | Admitting: Obstetrics and Gynecology

## 2019-12-22 ENCOUNTER — Other Ambulatory Visit (HOSPITAL_COMMUNITY)
Admission: RE | Admit: 2019-12-22 | Discharge: 2019-12-22 | Disposition: A | Discharge status: Home / Self Care | Payer: BC Managed Care – PPO | Source: Ambulatory Visit | Attending: Obstetrics and Gynecology | Admitting: Obstetrics and Gynecology

## 2019-12-22 ENCOUNTER — Other Ambulatory Visit: Payer: Self-pay

## 2019-12-22 DIAGNOSIS — Z20822 Contact with and (suspected) exposure to covid-19: Secondary | ICD-10-CM | POA: Insufficient documentation

## 2019-12-22 DIAGNOSIS — Z01812 Encounter for preprocedural laboratory examination: Secondary | ICD-10-CM | POA: Diagnosis present

## 2019-12-22 LAB — SARS CORONAVIRUS 2 (TAT 6-24 HRS): SARS Coronavirus 2: NEGATIVE

## 2019-12-22 NOTE — Progress Notes (Signed)
Spoke w/ via phone for pre-op interview---pt Lab needs dos----    Ekg, I stat 8, t & s, urine poct           Lab results------none COVID test ------12-22-2019 Arrive at -------1015 am 12-24-2019 NPO after MN NO Solid Food.  Clear liquids from MN until---915 am then npo Medications to take morning of surgery -----none Diabetic medication -----take 1/2 dose qhs tresibe night before surgery, no diabetic meds am of surgery Patient Special Instructions -----follow all l bowel prep instructions from dr April Manson Pre-Op special Istructions -----remove free style libra and ear piercing prior to surgery Patient verbalized understanding of instructions that were given at this phone interview. Patient denies shortness of breath, chest pain, fever, cough at this phone interview.

## 2019-12-24 ENCOUNTER — Encounter (HOSPITAL_BASED_OUTPATIENT_CLINIC_OR_DEPARTMENT_OTHER): Payer: Self-pay | Admitting: Obstetrics and Gynecology

## 2019-12-24 ENCOUNTER — Ambulatory Visit (HOSPITAL_BASED_OUTPATIENT_CLINIC_OR_DEPARTMENT_OTHER): Payer: BC Managed Care – PPO | Admitting: Certified Registered Nurse Anesthetist

## 2019-12-24 ENCOUNTER — Ambulatory Visit (HOSPITAL_BASED_OUTPATIENT_CLINIC_OR_DEPARTMENT_OTHER)
Admission: RE | Admit: 2019-12-24 | Discharge: 2019-12-24 | Disposition: A | Discharge status: Home / Self Care | Payer: BC Managed Care – PPO | Attending: Obstetrics and Gynecology | Admitting: Obstetrics and Gynecology

## 2019-12-24 ENCOUNTER — Encounter (HOSPITAL_BASED_OUTPATIENT_CLINIC_OR_DEPARTMENT_OTHER)
Admission: RE | Disposition: A | Discharge status: Home / Self Care | Payer: Self-pay | Source: Home / Self Care | Attending: Obstetrics and Gynecology

## 2019-12-24 DIAGNOSIS — N979 Female infertility, unspecified: Secondary | ICD-10-CM | POA: Insufficient documentation

## 2019-12-24 DIAGNOSIS — Z8616 Personal history of COVID-19: Secondary | ICD-10-CM | POA: Diagnosis not present

## 2019-12-24 DIAGNOSIS — Z794 Long term (current) use of insulin: Secondary | ICD-10-CM | POA: Diagnosis not present

## 2019-12-24 DIAGNOSIS — Z87891 Personal history of nicotine dependence: Secondary | ICD-10-CM | POA: Insufficient documentation

## 2019-12-24 DIAGNOSIS — N7011 Chronic salpingitis: Secondary | ICD-10-CM | POA: Insufficient documentation

## 2019-12-24 DIAGNOSIS — E109 Type 1 diabetes mellitus without complications: Secondary | ICD-10-CM | POA: Insufficient documentation

## 2019-12-24 DIAGNOSIS — Z79899 Other long term (current) drug therapy: Secondary | ICD-10-CM | POA: Diagnosis not present

## 2019-12-24 DIAGNOSIS — Z9049 Acquired absence of other specified parts of digestive tract: Secondary | ICD-10-CM | POA: Diagnosis not present

## 2019-12-24 DIAGNOSIS — R102 Pelvic and perineal pain: Secondary | ICD-10-CM | POA: Diagnosis not present

## 2019-12-24 DIAGNOSIS — K660 Peritoneal adhesions (postprocedural) (postinfection): Secondary | ICD-10-CM | POA: Diagnosis not present

## 2019-12-24 HISTORY — DX: Respiratory tuberculosis unspecified: A15.9

## 2019-12-24 HISTORY — DX: Chronic salpingitis: N70.11

## 2019-12-24 HISTORY — PX: LAPAROSCOPIC BILATERAL SALPINGECTOMY: SHX5889

## 2019-12-24 HISTORY — DX: Type 1 diabetes mellitus without complications: E10.9

## 2019-12-24 LAB — POCT I-STAT, CHEM 8
BUN: 14 mg/dL (ref 6–20)
Calcium, Ion: 1.23 mmol/L (ref 1.15–1.40)
Chloride: 108 mmol/L (ref 98–111)
Creatinine, Ser: 0.7 mg/dL (ref 0.44–1.00)
Glucose, Bld: 113 mg/dL — ABNORMAL HIGH (ref 70–99)
HCT: 42 % (ref 36.0–46.0)
Hemoglobin: 14.3 g/dL (ref 12.0–15.0)
Potassium: 3.9 mmol/L (ref 3.5–5.1)
Sodium: 142 mmol/L (ref 135–145)
TCO2: 22 mmol/L (ref 22–32)

## 2019-12-24 LAB — TYPE AND SCREEN
ABO/RH(D): A POS
Antibody Screen: NEGATIVE

## 2019-12-24 LAB — GLUCOSE, CAPILLARY: Glucose-Capillary: 112 mg/dL — ABNORMAL HIGH (ref 70–99)

## 2019-12-24 LAB — POCT PREGNANCY, URINE: Preg Test, Ur: NEGATIVE

## 2019-12-24 SURGERY — SALPINGECTOMY, BILATERAL, LAPAROSCOPIC
Anesthesia: General | Site: Abdomen | Laterality: Right

## 2019-12-24 MED ORDER — KETOROLAC TROMETHAMINE 30 MG/ML IJ SOLN: 30.0000 mg | Freq: Once | INTRAMUSCULAR | Status: DC | PRN

## 2019-12-24 MED ORDER — FENTANYL CITRATE (PF) 100 MCG/2ML IJ SOLN
INTRAMUSCULAR | Status: AC
  Filled 2019-12-24: qty 2

## 2019-12-24 MED ORDER — SODIUM CHLORIDE (PF) 0.9 % IJ SOLN
INTRAMUSCULAR | Status: AC
  Filled 2019-12-24: qty 10

## 2019-12-24 MED ORDER — DEXAMETHASONE SODIUM PHOSPHATE 10 MG/ML IJ SOLN
INTRAMUSCULAR | Status: DC | PRN
  Administered 2019-12-24: 10 mg via INTRAVENOUS

## 2019-12-24 MED ORDER — LIDOCAINE 2% (20 MG/ML) 5 ML SYRINGE
INTRAMUSCULAR | Status: DC | PRN
  Administered 2019-12-24: 100 mg via INTRAVENOUS

## 2019-12-24 MED ORDER — OXYCODONE-ACETAMINOPHEN 7.5-325 MG PO TABS: 1.0000 | ORAL_TABLET | ORAL | 0 refills | Status: DC | PRN

## 2019-12-24 MED ORDER — ONDANSETRON HCL 4 MG PO TABS: 4.0000 mg | ORAL_TABLET | Freq: Every day | ORAL | 1 refills | Status: DC | PRN

## 2019-12-24 MED ORDER — PROPOFOL 10 MG/ML IV BOLUS
INTRAVENOUS | Status: AC
  Filled 2019-12-24: qty 20

## 2019-12-24 MED ORDER — METHYLENE BLUE 0.5 % INJ SOLN
INTRAVENOUS | Status: DC | PRN
  Administered 2019-12-24: 3 mL

## 2019-12-24 MED ORDER — KETOROLAC TROMETHAMINE 30 MG/ML IJ SOLN
INTRAMUSCULAR | Status: DC | PRN
  Administered 2019-12-24: 30 mg via INTRAVENOUS

## 2019-12-24 MED ORDER — MIDAZOLAM HCL 2 MG/2ML IJ SOLN
INTRAMUSCULAR | Status: AC
  Filled 2019-12-24: qty 2

## 2019-12-24 MED ORDER — MIDAZOLAM HCL 2 MG/2ML IJ SOLN
INTRAMUSCULAR | Status: AC
  Filled 2019-12-24: qty 4

## 2019-12-24 MED ORDER — POVIDONE-IODINE 10 % EX SWAB: 2.0000 "application " | Freq: Once | CUTANEOUS | Status: DC

## 2019-12-24 MED ORDER — SCOPOLAMINE 1 MG/3DAYS TD PT72
MEDICATED_PATCH | TRANSDERMAL | Status: AC
  Filled 2019-12-24: qty 1

## 2019-12-24 MED ORDER — SCOPOLAMINE 1 MG/3DAYS TD PT72
MEDICATED_PATCH | TRANSDERMAL | Status: DC | PRN
  Administered 2019-12-24: 1 via TRANSDERMAL

## 2019-12-24 MED ORDER — HYDROMORPHONE HCL 1 MG/ML IJ SOLN
0.2500 mg | INTRAMUSCULAR | Status: DC | PRN
  Administered 2019-12-24 (×2): 0.5 mg via INTRAVENOUS

## 2019-12-24 MED ORDER — FENTANYL CITRATE (PF) 100 MCG/2ML IJ SOLN
25.0000 ug | INTRAMUSCULAR | Status: DC | PRN
  Administered 2019-12-24 (×3): 25 ug via INTRAVENOUS

## 2019-12-24 MED ORDER — SUGAMMADEX SODIUM 200 MG/2ML IV SOLN
INTRAVENOUS | Status: DC | PRN
  Administered 2019-12-24: 200 mg via INTRAVENOUS

## 2019-12-24 MED ORDER — FENTANYL CITRATE (PF) 250 MCG/5ML IJ SOLN
INTRAMUSCULAR | Status: AC
  Filled 2019-12-24: qty 5

## 2019-12-24 MED ORDER — OXYCODONE HCL 5 MG PO TABS
ORAL_TABLET | ORAL | Status: AC
  Filled 2019-12-24: qty 1

## 2019-12-24 MED ORDER — FENTANYL CITRATE (PF) 100 MCG/2ML IJ SOLN
INTRAMUSCULAR | Status: DC | PRN
Start: 2019-12-24 — End: 2019-12-24
  Administered 2019-12-24 (×2): 50 ug via INTRAVENOUS
  Administered 2019-12-24: 150 ug via INTRAVENOUS

## 2019-12-24 MED ORDER — LIDOCAINE 2% (20 MG/ML) 5 ML SYRINGE
INTRAMUSCULAR | Status: AC
  Filled 2019-12-24: qty 5

## 2019-12-24 MED ORDER — ONDANSETRON HCL 4 MG/2ML IJ SOLN
INTRAMUSCULAR | Status: AC
  Filled 2019-12-24: qty 2

## 2019-12-24 MED ORDER — PROPOFOL 10 MG/ML IV BOLUS
INTRAVENOUS | Status: DC | PRN
  Administered 2019-12-24: 200 mg via INTRAVENOUS

## 2019-12-24 MED ORDER — ROCURONIUM BROMIDE 10 MG/ML (PF) SYRINGE
PREFILLED_SYRINGE | INTRAVENOUS | Status: AC
  Filled 2019-12-24: qty 10

## 2019-12-24 MED ORDER — MIDAZOLAM HCL 5 MG/5ML IJ SOLN
INTRAMUSCULAR | Status: DC | PRN
  Administered 2019-12-24: 2 mg via INTRAVENOUS

## 2019-12-24 MED ORDER — CEFAZOLIN SODIUM 1 G IJ SOLR
INTRAMUSCULAR | Status: AC
  Filled 2019-12-24: qty 20

## 2019-12-24 MED ORDER — ONDANSETRON HCL 4 MG/2ML IJ SOLN: 4.0000 mg | Freq: Once | INTRAMUSCULAR | Status: DC | PRN

## 2019-12-24 MED ORDER — DEXAMETHASONE SODIUM PHOSPHATE 10 MG/ML IJ SOLN
INTRAMUSCULAR | Status: AC
  Filled 2019-12-24: qty 1

## 2019-12-24 MED ORDER — ACETAMINOPHEN 10 MG/ML IV SOLN
INTRAVENOUS | Status: AC
  Filled 2019-12-24: qty 100

## 2019-12-24 MED ORDER — KETOROLAC TROMETHAMINE 30 MG/ML IJ SOLN
INTRAMUSCULAR | Status: AC
  Filled 2019-12-24: qty 1

## 2019-12-24 MED ORDER — OXYCODONE HCL 5 MG/5ML PO SOLN: 5.0000 mg | Freq: Once | ORAL | Status: AC | PRN

## 2019-12-24 MED ORDER — HYDROMORPHONE HCL 1 MG/ML IJ SOLN
INTRAMUSCULAR | Status: AC
  Filled 2019-12-24: qty 1

## 2019-12-24 MED ORDER — DROPERIDOL 2.5 MG/ML IJ SOLN
INTRAMUSCULAR | Status: DC | PRN
  Administered 2019-12-24: .625 mg via INTRAVENOUS

## 2019-12-24 MED ORDER — CEFAZOLIN SODIUM-DEXTROSE 2-3 GM-%(50ML) IV SOLR
INTRAVENOUS | Status: DC | PRN
  Administered 2019-12-24: 2 g via INTRAVENOUS

## 2019-12-24 MED ORDER — BUPIVACAINE-EPINEPHRINE 0.25% -1:200000 IJ SOLN
INTRAMUSCULAR | Status: DC | PRN
  Administered 2019-12-24: 10 mL

## 2019-12-24 MED ORDER — ACETAMINOPHEN 10 MG/ML IV SOLN
1000.0000 mg | Freq: Once | INTRAVENOUS | Status: AC
  Administered 2019-12-24: 1000 mg via INTRAVENOUS

## 2019-12-24 MED ORDER — OXYCODONE HCL 5 MG PO TABS
5.0000 mg | ORAL_TABLET | Freq: Once | ORAL | Status: AC | PRN
  Administered 2019-12-24: 5 mg via ORAL

## 2019-12-24 MED ORDER — LACTATED RINGERS IV SOLN: INTRAVENOUS | Status: DC

## 2019-12-24 SURGICAL SUPPLY — 42 items
ADH SKN CLS APL DERMABOND .7 (GAUZE/BANDAGES/DRESSINGS) ×1
BAG RETRIEVAL 10 (BASKET)
BRR ADH 6X5 SEPRAFILM 1 SHT (MISCELLANEOUS) ×2
CABLE HIGH FREQUENCY MONO STRZ (ELECTRODE) ×2 IMPLANT
CATH ROBINSON RED A/P 18FR (CATHETERS) IMPLANT
CNTNR URN SCR LID CUP LEK RST (MISCELLANEOUS) ×1 IMPLANT
CONT SPEC 4OZ STRL OR WHT (MISCELLANEOUS) ×2
COVER MAYO STAND STRL (DRAPES) ×2 IMPLANT
COVER WAND RF STERILE (DRAPES) ×2 IMPLANT
DERMABOND ADVANCED (GAUZE/BANDAGES/DRESSINGS) ×1
DERMABOND ADVANCED .7 DNX12 (GAUZE/BANDAGES/DRESSINGS) ×1 IMPLANT
DRSG COVADERM PLUS 2X2 (GAUZE/BANDAGES/DRESSINGS) IMPLANT
DRSG OPSITE POSTOP 3X4 (GAUZE/BANDAGES/DRESSINGS) IMPLANT
DURAPREP 26ML APPLICATOR (WOUND CARE) ×2 IMPLANT
ELECT REM PT RETURN 9FT ADLT (ELECTROSURGICAL) ×2
ELECTRODE REM PT RTRN 9FT ADLT (ELECTROSURGICAL) ×1 IMPLANT
GAUZE 4X4 16PLY RFD (DISPOSABLE) ×2 IMPLANT
GLOVE BIO SURGEON STRL SZ8 (GLOVE) ×4 IMPLANT
GOWN STRL REUS W/TWL LRG LVL3 (GOWN DISPOSABLE) ×4 IMPLANT
MANIPULATOR UTERINE 4.5 ZUMI (MISCELLANEOUS) ×2 IMPLANT
NEEDLE INSUFFLATION 120MM (ENDOMECHANICALS) ×2 IMPLANT
PACK LAPAROSCOPY BASIN (CUSTOM PROCEDURE TRAY) ×2 IMPLANT
PACK TRENDGUARD 450 HYBRID PRO (MISCELLANEOUS) ×1 IMPLANT
SEPRAFILM MEMBRANE 5X6 (MISCELLANEOUS) ×4 IMPLANT
SET SUCTION IRRIG HYDROSURG (IRRIGATION / IRRIGATOR) ×2 IMPLANT
SET TUBE SMOKE EVAC HIGH FLOW (TUBING) ×2 IMPLANT
SHEARS HARMONIC ACE PLUS 36CM (ENDOMECHANICALS) ×2 IMPLANT
STOPCOCK 4 WAY LG BORE MALE ST (IV SETS) ×2 IMPLANT
SUT MNCRL AB 4-0 PS2 18 (SUTURE) ×4 IMPLANT
SYR 30ML LL (SYRINGE) ×2 IMPLANT
SYR 50ML LL SCALE MARK (SYRINGE) ×2 IMPLANT
SYR 5ML LL (SYRINGE) ×2 IMPLANT
SYR CONTROL 10ML LL (SYRINGE) IMPLANT
SYR TOOMEY IRRIG 70ML (MISCELLANEOUS) ×2
SYRINGE TOOMEY IRRIG 70ML (MISCELLANEOUS) ×1 IMPLANT
SYS BAG RETRIEVAL 10MM (BASKET)
SYSTEM BAG RETRIEVAL 10MM (BASKET) IMPLANT
TOWEL OR 17X26 10 PK STRL BLUE (TOWEL DISPOSABLE) ×2 IMPLANT
TRAY FOLEY W/BAG SLVR 14FR LF (SET/KITS/TRAYS/PACK) ×2 IMPLANT
TRENDGUARD 450 HYBRID PRO PACK (MISCELLANEOUS) ×2
TROCAR OPTI TIP 5M 100M (ENDOMECHANICALS) ×4 IMPLANT
WARMER LAPAROSCOPE (MISCELLANEOUS) ×2 IMPLANT

## 2019-12-24 NOTE — H&P (Signed)
Tammy Pollard is a 39 y.o. 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  HSG shows left hydrosalpinx with minimal spillage from the distal tube.  She has undergone in vitro fertilization and has cryopreserved embryos.  We recommended either salpingectomy or proximal tubal occlusion because of the known deleterious effects of hydrosalpinx and embryo implantation.  Patient would like to preserve her childbearing potential.  Pertinent Gynecological History: Menses: flow is excessive with use of 3 pads or tampons on heaviest days Bleeding: dysfunctional uterine bleeding Contraception: none DES exposure: denies Blood transfusions: none Sexually transmitted diseases: no past history Last pap: normal    Menstrual History: Menarche age: 53 LMP 7 days ago.  Past Medical History:  Diagnosis Date  . Abnormal Pap smear    CIN-I  . Chronic salpingitis   . Colitis, ulcerative (HCC)   . COVID-19 01/2019   mild cough congestion x 1 week all symptoms resolved  . DM type 1 (diabetes mellitus, type 1) (HCC)   . Hx of migraines yrs ago  . Tuberculosis    false positive ppd yrs ago chest xray done                    Past Surgical History:  Procedure Laterality Date  . COLECTOMY  1997   ulceratrive colitis  . WISDOM TOOTH EXTRACTION  yrs ago             Family History  Problem Relation Age of Onset  . Heart attack Paternal Grandfather   . Cancer Maternal Grandfather    No hereditary disease.  No cancer of breast, ovary, uterus.   Social History   Socioeconomic History  . Marital status: Married    Spouse name: Not on file  . Number of children: Not on file  . Years of education: Not on file  . Highest education level: Not on file  Occupational History  . Not on file  Tobacco Use  . Smoking status: Former Games developer  . Smokeless tobacco: Never Used  . Tobacco comment: < PACK A WEEK recreational    Vaping Use  . Vaping Use: Never used  Substance and Sexual Activity  . Alcohol use: Yes    Comment: OCCASIONAL  . Drug use: No  . Sexual activity: Yes    Birth control/protection: None    Comment: AMETHIA  Other Topics Concern  . Not on file  Social History Narrative  . Not on file   Social Determinants of Health   Financial Resource Strain:   . Difficulty of Paying Living Expenses: Not on file  Food Insecurity:   . Worried About Programme researcher, broadcasting/film/video in the Last Year: Not on file  . Ran Out of Food in the Last Year: Not on file  Transportation Needs:   . Lack of Transportation (Medical): Not on file  . Lack of Transportation (Non-Medical): Not on file  Physical Activity:   . Days of Exercise per Week: Not on file  . Minutes of Exercise per Session: Not on file  Stress:   . Feeling of Stress : Not on file  Social Connections:   . Frequency of Communication with Friends and Family: Not on file  . Frequency of Social Gatherings with Friends and Family: Not on file  . Attends Religious Services: Not on file  . Active Member of Clubs or Organizations: Not on file  . Attends Banker Meetings:  Not on file  . Marital Status: Not on file  Intimate Partner Violence:   . Fear of Current or Ex-Partner: Not on file  . Emotionally Abused: Not on file  . Physically Abused: Not on file  . Sexually Abused: Not on file    Allergies  Allergen Reactions  . Sulfa Antibiotics Rash    No current facility-administered medications on file prior to encounter.   Current Outpatient Medications on File Prior to Encounter  Medication Sig Dispense Refill  . acetaminophen (TYLENOL) 500 MG tablet Take 1,000 mg by mouth every 6 (six) hours as needed.    . Continuous Blood Gluc Receiver (FREESTYLE LIBRE READER) DEVI by Does not apply route.    Marland Kitchen ibuprofen (ADVIL,MOTRIN) 600 MG tablet Take 1 tablet (600 mg total) by mouth every 6 (six) hours. 30 tablet 0  . insulin aspart (NOVOLOG)  100 UNIT/ML injection Inject into the skin 3 (three) times daily before meals. Sliding scale 1 unit to 10 carbs    . Insulin Degludec (TRESIBA FLEXTOUCH Roxana) Inject into the skin. 8-9- units qhs    . melatonin 3 MG TABS tablet Take 3 mg by mouth at bedtime.    . metFORMIN (GLUCOPHAGE) 500 MG tablet Take by mouth every evening.    Marland Kitchen acetaminophen (TYLENOL) 325 MG tablet Take 650 mg by mouth every 6 (six) hours as needed.    . Prenat-FeFmCb-DSS-FA-DHA w/o A (CITRANATAL HARMONY) 27-1-260 MG CAPS Take 1 capsule by mouth daily.       Review of Systems  Constitutional: Negative.   HENT: Negative.   Eyes: Negative.   Respiratory: Negative.   Cardiovascular: Negative.   Gastrointestinal: Negative.   Genitourinary: Negative.   Musculoskeletal: Negative.   Skin: Negative.   Neurological: Negative.   Endo/Heme/Allergies: Negative.   Psychiatric/Behavioral: Negative.     Physical Exam  BP 122/76   Pulse 63   Temp 98.6 F (37 C) (Oral)   Resp 14   Ht 5\' 6"  (1.676 m)   Wt 66.9 kg   LMP 12/17/2019   SpO2 98%   BMI 23.81 kg/m  Constitutional: She is oriented to person, place, and time. She appears well-developed and well-nourished.  HENT:  Head: Normocephalic and atraumatic.  Nose: Nose normal.  Mouth/Throat: Oropharynx is clear and moist. No oropharyngeal exudate.  Eyes: Conjunctivae normal and EOM are normal. Pupils are equal, round, and reactive to light. No scleral icterus.  Neck: Normal range of motion. Neck supple. No tracheal deviation present. No thyromegaly present.  Cardiovascular: Normal rate.   Respiratory: Effort normal and breath sounds normal.  GI: Soft. Bowel sounds are normal. She exhibits no distension and no mass. There is no tenderness.  Lymphadenopathy:    She has no cervical adenopathy.  Neurological: She is alert and oriented to person, place, and time. She has normal reflexes.  Skin: Skin is warm.  Psychiatric: She has a normal mood and affect. Her behavior is  normal. Judgment and thought content normal.   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 Benefits and risks of the proposed procedures were discussed with the patient and her family member again.  Bowel prep instructions were given.  All of patient's questions were answered.  She verbalized understanding.    12/19/2019, MD

## 2019-12-24 NOTE — Anesthesia Postprocedure Evaluation (Signed)
Anesthesia Post Note  Patient: Tammy Pollard  Procedure(s) Performed: LAPAROSCOPIC REMOVAL OF HYDROSALPINX; LYSIS OF ADHESIONS, RIGHT SALPINGECTOMY AND LEFT SALPINGO  ENTEROLYSIS (Right Abdomen)     Patient location during evaluation: PACU Anesthesia Type: General Level of consciousness: awake and alert Pain management: pain level controlled Vital Signs Assessment: post-procedure vital signs reviewed and stable Respiratory status: spontaneous breathing, nonlabored ventilation, respiratory function stable and patient connected to nasal cannula oxygen Cardiovascular status: blood pressure returned to baseline and stable Postop Assessment: no apparent nausea or vomiting Anesthetic complications: no   No complications documented.  Last Vitals:  Vitals:   12/24/19 1445 12/24/19 1500  BP: 110/75 103/67  Pulse: 87 84  Resp: 17 15  Temp:    SpO2: 100% 99%    Last Pain:  Vitals:   12/24/19 1500  TempSrc:   PainSc: 7                  Frisco Cordts S

## 2019-12-24 NOTE — Op Note (Signed)
Operative Note  Preoperative diagnosis: Left hydrosalpinx, suspected pelvic and abdominal adhesions, infertility, pelvic pain  Postoperative diagnosis: Right hydrosalpinx, extensive pelvic and abdominal intestinal adhesions (70 min), infertility, pelvic pain  Procedure: Laparoscopy, lysis of adhesions, enterolysis, left salpingoovariolysis, right salpingectomy, chromotubation  Anesthesia: Gen. Endotracheal  Surgeon: Fermin Schwab, MD  Assistant: Mora Appl, MD, PGY 2  Complications: None  Estimated blood loss: <10 cc  Specimens: Right hydrosalpinx to pathology  Findings: On laparoscopy, there were extensive adhesions in the right upper quadrant underneath the umbilicus and at midline and lower abdomen due to previous surgery.  After adhesiolysis, upper abdomen, liver surface and diaphragm surfaces were normal.  Gallbladder appeared grossly normal, appendix was not visualized and probably surgically absent.   We then encountered the cysts adhesions between loops of small bowel status post colectomy and between small bowel and at midline posterior uterine serosa as well as filmy adhesions between the right hydrosalpinx and small bowel.  Initially the posterior cul-de-sac was partially obliterated and after extensive adhesiolysis mostly with was freed up.  Anterior cul-de-sac was normal the uterus was otherwise normal it sounded to 8 cm. Left tube was adherent with 80% of its length to the ovary and to the pelvic sidewall with filmy adhesions and these were freed up.  The fimbria were rated at 4 out of 5.  It was patent to chromotubation.   The left ovary had cohesive adhesions to the posterior uterine serosa covering 30% with surface area and this was only partially freed up.   As mentioned above the right tube was 90% covered with filmy and dense adhesions and was stretched towards the right iliac fossa in the form of a hydrosalpinx.  After extensive adhesiolysis, its distal tube could be  brought out of the deep pelvic adhesions.  It did not contain any fimbria. The right ovary was covered with 90% of the surface area between cohesive adhesions to the small intestine and to the pelvic sidewall and to the right tube and no further attempt was made to free up the right ovary after separating the right hydrosalpinx from the adnexa. There were no lesions of peritoneal endometriosis.  Description of the procedure: The patient was placed in dorsal supine position and general endotracheal anesthesia was given. 2 g of cefazolin were given intravenously for prophylaxis. Patient was placed in lithotomy position. She was prepped and draped inside manner.  The bladder was catheterized with a Foley. A ZUMI catheter was placed into the uterine cavity.  The uterus sounded to 8 cm.  The surgeon was regloved and a surgical field was created on the abdomen.  After preemptive anesthesia of all surgical sites with 0.25% bupivacaine with 1 200,000 epinephrine, the proposed intraumbilical incision site was first probed with an 18-gauge needle attached to a 10 cc of saline in a syringe and no intestinal contents were obtained and it was considered safe enough to proceed.  A 5 mm intraumbilical skin incision was made and a Verress needle was inserted. Its correct location was confirmed. A pneumoperitoneum was created with carbon dioxide.  5 mm laparoscope with a 30 lens was inserted and video laparoscopy was started . A left lower quadrant 5 mm and a right mid abdominal 5 mm incisions were made at the previous scars and ancillary trochars were placed under direct visualization. Above findings were noted. I spent 45 minutes to carefully lyse the adhesions in the abdominal cavity between the omentum and small bowel and anterior abdominal wall.  Furthermore it  took another 35 minutes to lyse the small bowel adhesions to the uterus and right adnexa.  This was necessary to complete the intended procedure.  We used  harmonic Ace and needle tip electrode with 35 W of cutting current where appropriate. First the left tube was freed up from its adhesions to the ovary and pelvic sidewall.  Chromotubation showed patency of the left tube.  Chromotubation confirmed patency of the left tube.  The right tube was mostly covered under intestinal adhesions and showed hydrosalpinx. Using sharp scissors and needle electrode the filmy adhesions between the right adnexa and small intestines were taken down carefully.  Decision was made to remove the damaged right tube showing hydrosalpinx.  This was accomplished by transecting the right uterotubal junction with harmonic Ace and then successive application of harmonic Ace on the mesosalpinx very close to the tube and desiccating and cutting.  Finally the tube was removed in its entirety, with very little fimbriated end being left behind, and taken out of the right lower laparoscopic port. Pelvis was copiously irrigated with warm saline solution and hemostasis was insured.  The gas was allowed to escape.  Instrument and lap pad count was correct. The incisions were approximated with Dermabond.   The patient tolerated the procedure well and was transferred to recovery room in satisfactory condition.  Fermin Schwab, MD

## 2019-12-24 NOTE — Anesthesia Procedure Notes (Signed)
Procedure Name: Intubation Date/Time: 12/24/2019 12:32 PM Performed by: Rogers Blocker, CRNA Pre-anesthesia Checklist: Patient identified, Emergency Drugs available, Suction available and Patient being monitored Patient Re-evaluated:Patient Re-evaluated prior to induction Oxygen Delivery Method: Circle System Utilized Preoxygenation: Pre-oxygenation with 100% oxygen Induction Type: IV induction Ventilation: Mask ventilation without difficulty Laryngoscope Size: Mac and 3 Grade View: Grade I Tube type: Oral Number of attempts: 1 Airway Equipment and Method: Stylet and Oral airway Placement Confirmation: ETT inserted through vocal cords under direct vision,  positive ETCO2 and breath sounds checked- equal and bilateral Secured at: 21 cm Tube secured with: Tape Dental Injury: Teeth and Oropharynx as per pre-operative assessment

## 2019-12-24 NOTE — Anesthesia Preprocedure Evaluation (Signed)
Anesthesia Evaluation  Patient identified by MRN, date of birth, ID band Patient awake    Reviewed: Allergy & Precautions, NPO status , Patient's Chart, lab work & pertinent test results  Airway Mallampati: II  TM Distance: >3 FB Neck ROM: Full    Dental no notable dental hx.    Pulmonary neg pulmonary ROS, former smoker,    Pulmonary exam normal breath sounds clear to auscultation       Cardiovascular negative cardio ROS Normal cardiovascular exam Rhythm:Regular Rate:Normal     Neuro/Psych negative neurological ROS  negative psych ROS   GI/Hepatic negative GI ROS, Neg liver ROS,   Endo/Other  diabetes, Type 1, Insulin Dependent  Renal/GU negative Renal ROS  negative genitourinary   Musculoskeletal negative musculoskeletal ROS (+)   Abdominal   Peds negative pediatric ROS (+)  Hematology negative hematology ROS (+)   Anesthesia Other Findings   Reproductive/Obstetrics negative OB ROS                             Anesthesia Physical Anesthesia Plan  ASA: II  Anesthesia Plan: General   Post-op Pain Management:    Induction: Intravenous  PONV Risk Score and Plan: 3 and Ondansetron, Dexamethasone and Treatment may vary due to age or medical condition  Airway Management Planned: LMA  Additional Equipment:   Intra-op Plan:   Post-operative Plan: Extubation in OR  Informed Consent: I have reviewed the patients History and Physical, chart, labs and discussed the procedure including the risks, benefits and alternatives for the proposed anesthesia with the patient or authorized representative who has indicated his/her understanding and acceptance.     Dental advisory given  Plan Discussed with: CRNA and Surgeon  Anesthesia Plan Comments:         Anesthesia Quick Evaluation

## 2019-12-24 NOTE — Transfer of Care (Signed)
Immediate Anesthesia Transfer of Care Note  Patient: Tammy Pollard  Procedure(s) Performed: LAPAROSCOPIC REMOVAL OF HYDROSALPINX; LYSIS OF ADHESIONS, RIGHT SALPINGECTOMY AND LEFT SALPINGO  ENTEROLYSIS (Right Abdomen)  Patient Location: PACU  Anesthesia Type:General  Level of Consciousness: awake, oriented and patient cooperative  Airway & Oxygen Therapy: Patient Spontanous Breathing and Patient connected to nasal cannula oxygen  Post-op Assessment: Report given to RN and Post -op Vital signs reviewed and stable  Post vital signs: Reviewed and stable  Last Vitals:  Vitals Value Taken Time  BP 116/74 12/24/19 1435  Temp 36.6 C 12/24/19 1435  Pulse 81 12/24/19 1442  Resp 17 12/24/19 1442  SpO2 100 % 12/24/19 1442  Vitals shown include unvalidated device data.  Last Pain:  Vitals:   12/24/19 1038  TempSrc: Oral  PainSc: 0-No pain      Patients Stated Pain Goal: 3 (12/24/19 1038)  Complications: No complications documented.

## 2019-12-24 NOTE — Discharge Instructions (Signed)

## 2019-12-25 ENCOUNTER — Encounter (HOSPITAL_BASED_OUTPATIENT_CLINIC_OR_DEPARTMENT_OTHER): Payer: Self-pay | Admitting: Obstetrics and Gynecology

## 2019-12-25 LAB — SURGICAL PATHOLOGY

## 2020-03-26 NOTE — L&D Delivery Note (Signed)
Delivery Note:   G3P1011 at [redacted]w[redacted]d  Admitting diagnosis: Normal labor and delivery [O80] Risks: BPP 6/8, IDDM, GBS positive, fetal pelvic kidney Onset of labor: 10/21/2020 at 0901 with AROM IOL/Augmentation: AROM, Pitocin, and IP Foley ROM: 10/21/2020 at 0901, clear fluid  Complete dilation at 10/21/2020 at 1035 Onset of pushing at 1035 FHR second stage Cat II, variables with pushing  Analgesia /Anesthesia intrapartum:Epidural  Pushing in lithotomy position with CNM and L&D staff support. Husband, Sheria Lang, mother, Leandro Reasoner, and doula, Seward Grater, present for birth and supportive.  Delivery of a Live born female  Birth Weight:  3355g, 7lb 6.3oz APGAR: 8, 9  Newborn Delivery   Birth date/time: 10/21/2020 10:45:00 Delivery type: Vaginal, Spontaneous     in cephalic presentation, position OA to LOA.  APGAR:1 min-8 , 5 min-9   Nuchal Cord: No  Cord double clamped after cessation of pulsation, cut by Sheria Lang.  Collection of cord blood for typing completed. Cord blood donation-None  Arterial cord blood sample-No    Placenta delivered-Spontaneous;Pathology  with 3 vessels . Uterotonics: Pitocin Placenta to L&D Uterine tone firm  Bleeding scant  No laceration identified.  Episiotomy:None  Local analgesia: N/A  Repair: N/A Est. Blood Loss (mL):100.00   Complications: None  Mom to postpartum.  Baby Bryson to Couplet care / Skin to Skin.  Delivery Report:   Review the Delivery Report for details.    June Leap, CNM, MSN 10/21/2020, 12:07 PM

## 2020-06-27 ENCOUNTER — Other Ambulatory Visit: Payer: Self-pay | Admitting: Obstetrics and Gynecology

## 2020-06-27 ENCOUNTER — Other Ambulatory Visit: Payer: Self-pay

## 2020-06-27 ENCOUNTER — Telehealth: Payer: Self-pay

## 2020-06-27 DIAGNOSIS — Z363 Encounter for antenatal screening for malformations: Secondary | ICD-10-CM

## 2020-06-27 NOTE — Telephone Encounter (Signed)
Received a STAT referral from Dr. Billy Coast for this patient to confirm a renal abnormality. Scheduled her for tomorrow and left voicemail with information to call us back.

## 2020-06-28 ENCOUNTER — Other Ambulatory Visit: Payer: Self-pay

## 2020-06-28 ENCOUNTER — Ambulatory Visit: Payer: BC Managed Care – PPO | Admitting: *Deleted

## 2020-06-28 ENCOUNTER — Other Ambulatory Visit: Payer: Self-pay | Admitting: *Deleted

## 2020-06-28 ENCOUNTER — Encounter: Payer: Self-pay | Admitting: *Deleted

## 2020-06-28 ENCOUNTER — Ambulatory Visit: Payer: BC Managed Care – PPO | Attending: Obstetrics and Gynecology

## 2020-06-28 VITALS — BP 116/71 | HR 85

## 2020-06-28 DIAGNOSIS — E109 Type 1 diabetes mellitus without complications: Secondary | ICD-10-CM

## 2020-06-28 DIAGNOSIS — O283 Abnormal ultrasonic finding on antenatal screening of mother: Secondary | ICD-10-CM | POA: Diagnosis not present

## 2020-06-28 DIAGNOSIS — K5190 Ulcerative colitis, unspecified, without complications: Secondary | ICD-10-CM

## 2020-06-28 DIAGNOSIS — O09522 Supervision of elderly multigravida, second trimester: Secondary | ICD-10-CM | POA: Diagnosis not present

## 2020-06-28 DIAGNOSIS — E119 Type 2 diabetes mellitus without complications: Secondary | ICD-10-CM | POA: Diagnosis not present

## 2020-06-28 DIAGNOSIS — Z8632 Personal history of gestational diabetes: Secondary | ICD-10-CM

## 2020-06-28 DIAGNOSIS — O99612 Diseases of the digestive system complicating pregnancy, second trimester: Secondary | ICD-10-CM

## 2020-06-28 DIAGNOSIS — Z363 Encounter for antenatal screening for malformations: Secondary | ICD-10-CM

## 2020-06-28 DIAGNOSIS — O09812 Supervision of pregnancy resulting from assisted reproductive technology, second trimester: Secondary | ICD-10-CM

## 2020-06-28 DIAGNOSIS — Z3A21 21 weeks gestation of pregnancy: Secondary | ICD-10-CM

## 2020-06-28 DIAGNOSIS — O321XX0 Maternal care for breech presentation, not applicable or unspecified: Secondary | ICD-10-CM

## 2020-06-28 DIAGNOSIS — O24112 Pre-existing diabetes mellitus, type 2, in pregnancy, second trimester: Secondary | ICD-10-CM | POA: Diagnosis not present

## 2020-08-05 ENCOUNTER — Ambulatory Visit: Payer: BC Managed Care – PPO

## 2020-10-20 ENCOUNTER — Inpatient Hospital Stay (HOSPITAL_COMMUNITY)
Admission: AD | Admit: 2020-10-20 | Discharge: 2020-10-22 | DRG: 805 | Disposition: A | Discharge status: Home / Self Care | Payer: BC Managed Care – PPO | Attending: Obstetrics & Gynecology | Admitting: Obstetrics & Gynecology

## 2020-10-20 ENCOUNTER — Other Ambulatory Visit: Payer: Self-pay

## 2020-10-20 ENCOUNTER — Encounter (HOSPITAL_COMMUNITY): Payer: Self-pay | Admitting: Obstetrics and Gynecology

## 2020-10-20 DIAGNOSIS — O368330 Maternal care for abnormalities of the fetal heart rate or rhythm, third trimester, not applicable or unspecified: Secondary | ICD-10-CM

## 2020-10-20 DIAGNOSIS — Z8616 Personal history of COVID-19: Secondary | ICD-10-CM

## 2020-10-20 DIAGNOSIS — B951 Streptococcus, group B, as the cause of diseases classified elsewhere: Secondary | ICD-10-CM | POA: Diagnosis present

## 2020-10-20 DIAGNOSIS — O99824 Streptococcus B carrier state complicating childbirth: Secondary | ICD-10-CM | POA: Diagnosis present

## 2020-10-20 DIAGNOSIS — O358XX0 Maternal care for other (suspected) fetal abnormality and damage, not applicable or unspecified: Secondary | ICD-10-CM | POA: Diagnosis present

## 2020-10-20 DIAGNOSIS — Z20822 Contact with and (suspected) exposure to covid-19: Secondary | ICD-10-CM | POA: Diagnosis present

## 2020-10-20 DIAGNOSIS — Z794 Long term (current) use of insulin: Secondary | ICD-10-CM

## 2020-10-20 DIAGNOSIS — Z87891 Personal history of nicotine dependence: Secondary | ICD-10-CM | POA: Diagnosis not present

## 2020-10-20 DIAGNOSIS — Z23 Encounter for immunization: Secondary | ICD-10-CM

## 2020-10-20 DIAGNOSIS — E109 Type 1 diabetes mellitus without complications: Secondary | ICD-10-CM | POA: Diagnosis present

## 2020-10-20 DIAGNOSIS — Z3A38 38 weeks gestation of pregnancy: Secondary | ICD-10-CM

## 2020-10-20 DIAGNOSIS — O2402 Pre-existing diabetes mellitus, type 1, in childbirth: Secondary | ICD-10-CM | POA: Diagnosis present

## 2020-10-20 DIAGNOSIS — O368390 Maternal care for abnormalities of the fetal heart rate or rhythm, unspecified trimester, not applicable or unspecified: Secondary | ICD-10-CM

## 2020-10-20 LAB — TYPE AND SCREEN
ABO/RH(D): A POS
Antibody Screen: NEGATIVE

## 2020-10-20 LAB — CBC
HCT: 41 % (ref 36.0–46.0)
Hemoglobin: 14.1 g/dL (ref 12.0–15.0)
MCH: 30.8 pg (ref 26.0–34.0)
MCHC: 34.4 g/dL (ref 30.0–36.0)
MCV: 89.5 fL (ref 80.0–100.0)
Platelets: 317 10*3/uL (ref 150–400)
RBC: 4.58 MIL/uL (ref 3.87–5.11)
RDW: 13.3 % (ref 11.5–15.5)
WBC: 12.7 10*3/uL — ABNORMAL HIGH (ref 4.0–10.5)
nRBC: 0 % (ref 0.0–0.2)

## 2020-10-20 LAB — RESP PANEL BY RT-PCR (FLU A&B, COVID) ARPGX2
Influenza A by PCR: NEGATIVE
Influenza B by PCR: NEGATIVE
SARS Coronavirus 2 by RT PCR: NEGATIVE

## 2020-10-20 MED ORDER — TERBUTALINE SULFATE 1 MG/ML IJ SOLN: 0.2500 mg | Freq: Once | INTRAMUSCULAR | Status: DC | PRN

## 2020-10-20 MED ORDER — OXYTOCIN-SODIUM CHLORIDE 30-0.9 UT/500ML-% IV SOLN
1.0000 m[IU]/min | INTRAVENOUS | Status: DC
  Filled 2020-10-20: qty 500

## 2020-10-20 MED ORDER — FLEET ENEMA 7-19 GM/118ML RE ENEM: 1.0000 | ENEMA | RECTAL | Status: DC | PRN

## 2020-10-20 MED ORDER — ONDANSETRON HCL 4 MG/2ML IJ SOLN
4.0000 mg | Freq: Four times a day (QID) | INTRAMUSCULAR | Status: DC | PRN
  Administered 2020-10-21: 4 mg via INTRAVENOUS
  Filled 2020-10-20: qty 2

## 2020-10-20 MED ORDER — LACTATED RINGERS IV SOLN
500.0000 mL | INTRAVENOUS | Status: DC | PRN
Start: 2020-10-20 — End: 2020-10-21
  Administered 2020-10-21: 1000 mL via INTRAVENOUS
  Administered 2020-10-21: 500 mL via INTRAVENOUS

## 2020-10-20 MED ORDER — OXYTOCIN-SODIUM CHLORIDE 30-0.9 UT/500ML-% IV SOLN: 2.5000 [IU]/h | INTRAVENOUS | Status: DC

## 2020-10-20 MED ORDER — ACETAMINOPHEN 325 MG PO TABS: 650.0000 mg | ORAL_TABLET | ORAL | Status: DC | PRN

## 2020-10-20 MED ORDER — LIDOCAINE HCL (PF) 1 % IJ SOLN: 30.0000 mL | INTRAMUSCULAR | Status: DC | PRN

## 2020-10-20 MED ORDER — OXYTOCIN BOLUS FROM INFUSION
333.0000 mL | Freq: Once | INTRAVENOUS | Status: AC
  Administered 2020-10-21: 333 mL via INTRAVENOUS

## 2020-10-20 MED ORDER — SODIUM CHLORIDE 0.9 % IV SOLN
5.0000 10*6.[IU] | Freq: Once | INTRAVENOUS | Status: AC
  Administered 2020-10-20: 5 10*6.[IU] via INTRAVENOUS
  Filled 2020-10-20: qty 5

## 2020-10-20 MED ORDER — OXYCODONE-ACETAMINOPHEN 5-325 MG PO TABS: 2.0000 | ORAL_TABLET | ORAL | Status: DC | PRN

## 2020-10-20 MED ORDER — PENICILLIN G POT IN DEXTROSE 60000 UNIT/ML IV SOLN
3.0000 10*6.[IU] | INTRAVENOUS | Status: DC
  Administered 2020-10-21 (×2): 3 10*6.[IU] via INTRAVENOUS
  Filled 2020-10-20 (×2): qty 50

## 2020-10-20 MED ORDER — LACTATED RINGERS IV SOLN: INTRAVENOUS | Status: DC

## 2020-10-20 MED ORDER — OXYCODONE-ACETAMINOPHEN 5-325 MG PO TABS: 1.0000 | ORAL_TABLET | ORAL | Status: DC | PRN

## 2020-10-20 MED ORDER — SOD CITRATE-CITRIC ACID 500-334 MG/5ML PO SOLN
30.0000 mL | ORAL | Status: DC | PRN
  Administered 2020-10-21: 30 mL via ORAL
  Filled 2020-10-20: qty 30

## 2020-10-20 NOTE — MAU Note (Signed)
Tammy Pollard is a 40 y.o. at [redacted]w[redacted]d here in MAU reporting: was sent over from the office for BPP of 6/10. +FM. No pain, bleeding, or LOF.  Onset of complaint: today  Pain score: 0/10  Vitals:   10/20/20 1808  BP: 128/79  Pulse: 72  Resp: 16  Temp: 98.3 F (36.8 C)  SpO2: 98%     FHT:145  Lab orders placed from triage: none

## 2020-10-20 NOTE — H&P (Signed)
OB ADMISSION/ HISTORY & PHYSICAL:  Admission Date: 10/20/2020  5:37 PM  Admit Diagnosis: Normal labor and delivery [O80]    Tammy Pollard is a 40 y.o. female at [redacted]w[redacted]d presenting for induction of labor due to non-reassuring fetal testing. Patient was seen in the office this afternoon for an appointment with BPP. BPP was 6/8, 2 off for breathing. Patient was sent to MAU for extended monitoring and the FHT was overall reactive but at 2000 there was spontaneous, prolonged decel to the 70s. She was admitted for induction of labor. Denies contractions, leaking of fluid, or vaginal bleeding. Endorses + fetal movement. Type 1 diabetic, well controlled on insulin. GBS positive. Husband, Sheria Lang, and mother, Leandro Reasoner, at the bedside for labor support. Planning unmedicated labor and delivery. Eagerly anticipating baby boy "Bryson".   Prenatal History: G3P1011   EDC : 11/03/2020 Prenatal care at [facility] since 10 weeks, primary Dr. Billy Coast, transfer to midwifery care at 30 weeks for waterbirth option, primary D. Renae Fickle CNM  Prenatal course complicated by: Type 1 DM, on insulin and metformin, managed by endocrinology, A1C at NOB 5.6, normal fetal echo, last EFW 52% at 35 weeks Fetal pelvic kidney on right IVF pregnancy AMA GBS positive  Prenatal Labs: ABO, Rh:   A POS Antibody: Negative Rubella:   Immune RPR:   Non-reactive HBsAg:   Negative HIV:   Negative GBS:   Positive Genetic Screening: NIPT low risk XY, AFP-1 negative Ultrasound: normal XY anatomy with exception of right pelvic kidney, anterior placenta, AFI WNL, BPP today 6/8 (2 off for breathing), normal fetal echo, last EFW 52% at 35 weeks  TDaP          UTD COVID-19 UTD    Maternal Diabetes: Yes:  Diabetes Type:  Pre-pregnancy Genetic Screening: Normal Maternal Ultrasounds/Referrals: Normal and Fetal Kidney Anomalies Fetal Ultrasounds or other Referrals:  Fetal echo Maternal Substance Abuse:  No Significant Maternal Medications:   Meds include: Other: insulin and metformin Significant Maternal Lab Results:  Group B Strep positive Other Comments:  None  Medical / Surgical History : Past medical history:  Past Medical History:  Diagnosis Date   Abnormal Pap smear    CIN-I   Chronic salpingitis    Colitis, ulcerative (HCC)    COVID-19 01/2019   mild cough congestion x 1 week all symptoms resolved   DM type 1 (diabetes mellitus, type 1) (HCC)    Headache    migraines   Hx of migraines yrs ago   Tuberculosis    false positive ppd yrs ago chest xray done    Past surgical history:  Past Surgical History:  Procedure Laterality Date   COLECTOMY  1997   ulceratrive colitis   LAPAROSCOPIC BILATERAL SALPINGECTOMY Right 12/24/2019   Procedure: LAPAROSCOPIC REMOVAL OF HYDROSALPINX; LYSIS OF ADHESIONS, RIGHT SALPINGECTOMY AND LEFT SALPINGO  ENTEROLYSIS;  Surgeon: Fermin Schwab, MD;  Location: [facility];  Service: Gynecology;  Laterality: Right;   WISDOM TOOTH EXTRACTION  yrs ago    Family History:  Family History  Problem Relation Age of Onset   Heart attack Paternal Grandfather    Cancer Maternal Grandfather     Social History:  reports that she has quit smoking. She has never used smokeless tobacco. She reports current alcohol use. She reports that she does not use drugs. Allergies: Sulfa antibiotics   Current Medications at time of admission:  Medications Prior to Admission  Medication Sig Dispense Refill Last Dose   Continuous Blood Gluc Receiver (FREESTYLE  LIBRE READER) DEVI by Does not apply route.      insulin aspart (NOVOLOG) 100 UNIT/ML injection Inject into the skin 3 (three) times daily before meals. Sliding scale 1 unit to 10 carbs      Insulin Degludec (TRESIBA FLEXTOUCH [place]) Inject into the skin. 8-9- units qhs      metFORMIN (GLUCOPHAGE) 500 MG tablet Take by mouth every evening.      Prenat-FeFmCb-DSS-FA-DHA w/o A (CITRANATAL HARMONY) 27-1-260 MG CAPS Take 1 capsule by mouth  daily.       Review of Systems: Review of Systems  All other systems reviewed and are negative.  Physical Exam: Vital signs and nursing notes reviewed.  Patient Vitals for the past 24 hrs:  BP Temp Temp src Pulse Resp SpO2 Height Weight  10/20/20 2108 126/77 98.1 F (36.7 C) Oral 60 -- -- -- --  10/20/20 1808 128/79 98.3 F (36.8 C) Oral 72 16 98 % -- --  10/20/20 1804 -- -- -- -- -- -- 5\' 7"  (1.702 m) 83 kg    General: AAO x 3, NAD Heart: RRR Lungs:CTAB Abdomen: Gravid, NT Extremities: no edema Genitalia / VE: Dilation: 2 Effacement (%): 50 Presentation: Vertex Exam by:: [email or web address], CNM   Foley balloon placed without difficulty. 60cc of fluid instilled in balloon and taped to leg for traction. Patient tolerated procedure well.   FHR: 120BPM, mod variability, + accels, no decels TOCO: Contractions occasional  Labs:   Pending T&S, CBC, RPR  Recent Labs    10/20/20 2040  WBC 12.7*  HGB 14.1  HCT 41.0  PLT 317   Assessment: 40 y.o. G3P1011 at [redacted]w[redacted]d, IDDM, BPP 6/8  1. Induction of labor for non-reassuring fetal testing 2. FHR category I 3. GBS positive 4. Desires unmedicated birth 5. Plans to breastfeed 6. IDDM managed by endocrinology, patient with continuous blood glucose reader and manages own insulin needs 7. Fetal pelvic kidney 8. AMA  Plan:  1. Admit to BS 2. Routine L&D orders 3. Analgesia/anesthesia PRN 4. Continuous EFM  5. Foley balloon placed for cervical ripening 6. Will start Pitocin after 4 hours or when cervical balloon is expelled 7. Consider AROM in active labor 8. PCN for + GBS 9. Patient to monitor blood sugar via continuous reader, nursing to document blood sugar q 4 hours. Per consult with Dr. 8/8, patient may manage own insulin needs with home insulin. Will start Endotool and/or IV insulin if patient unable to maintain euglycemic control in labor. 10. May use shower for hydrotherapy, nitrous oxide for pain relief 11. Peds to  follow after delivery for pelvic kidney 12. Anticipate NSVB  Dr. Billy Coast notified of admission/plan of care.  Juliene Pina CNM, MSN 10/20/2020, 9:31 PM

## 2020-10-20 NOTE — MAU Provider Note (Signed)
History     CSN: [id number]  Arrival date and time: 10/20/20 1737   Event Date/Time   First Provider Initiated Contact with Patient 10/20/20 1852      Chief Complaint  Patient presents with   Monitoring   HPI  [name] is a 40 y.o.female G3P1011 @ [redacted]w[redacted]d here in MAU from the office for an NST. She is is type 1 diabetic with a planned induction @ 39 weeks. She had a BPP of 6/8 today with 2 off for breathing. She reports good fetal movement. No concerns.  GBS +  OB History     Gravida  3   Para  1   Term  1   Preterm      AB  1   Living  1      SAB  1   IAB      Ectopic      Multiple  0   Live Births  1           Past Medical History:  Diagnosis Date   Abnormal Pap smear    CIN-I   Chronic salpingitis    Colitis, ulcerative (HCC)    COVID-19 01/2019   mild cough congestion x 1 week all symptoms resolved   DM type 1 (diabetes mellitus, type 1) (HCC)    Headache    migraines   Hx of migraines yrs ago   Tuberculosis    false positive ppd yrs ago chest xray done    Past Surgical History:  Procedure Laterality Date   COLECTOMY  1997   ulceratrive colitis   LAPAROSCOPIC BILATERAL SALPINGECTOMY Right 12/24/2019   Procedure: LAPAROSCOPIC REMOVAL OF HYDROSALPINX; LYSIS OF ADHESIONS, RIGHT SALPINGECTOMY AND LEFT SALPINGO  ENTEROLYSIS;  Surgeon: Fermin Schwab, MD;  Location: [facility];  Service: Gynecology;  Laterality: Right;   WISDOM TOOTH EXTRACTION  yrs ago    Family History  Problem Relation Age of Onset   Heart attack Paternal Grandfather    Cancer Maternal Grandfather     Social History   Tobacco Use   Smoking status: Former   Smokeless tobacco: Never   Tobacco comments:    < PACK A WEEK recreational   Vaping Use   Vaping Use: Never used  Substance Use Topics   Alcohol use: Yes    Comment: OCCASIONAL   Drug use: No    Allergies:  Allergies  Allergen Reactions   Sulfa Antibiotics Rash     Medications Prior to Admission  Medication Sig Dispense Refill Last Dose   Continuous Blood Gluc Receiver (FREESTYLE LIBRE READER) DEVI by Does not apply route.      insulin aspart (NOVOLOG) 100 UNIT/ML injection Inject into the skin 3 (three) times daily before meals. Sliding scale 1 unit to 10 carbs      Insulin Degludec (TRESIBA FLEXTOUCH [place]) Inject into the skin. 8-9- units qhs      metFORMIN (GLUCOPHAGE) 500 MG tablet Take by mouth every evening.      Prenat-FeFmCb-DSS-FA-DHA w/o A (CITRANATAL HARMONY) 27-1-260 MG CAPS Take 1 capsule by mouth daily.      No results found for this or any previous visit (from the past 48 hour(s)).   Review of Systems  Gastrointestinal:  Negative for abdominal pain.  Genitourinary:  Negative for vaginal bleeding and vaginal discharge.  Physical Exam   Blood pressure 128/79, pulse 72, temperature 98.3 F (36.8 C), temperature source Oral, resp. rate 16, height 5\' 7"  (1.702 m), weight 83  kg, SpO2 98 %, unknown if currently breastfeeding.  Physical Exam Constitutional:      General: She is not in acute distress.    Appearance: Normal appearance. She is not ill-appearing, toxic-appearing or diaphoretic.  Neurological:     Mental Status: She is alert and oriented to person, place, and time.  Psychiatric:        Mood and Affect: Mood normal.   Fetal Tracing: Baseline: 110 bpm Variability: Moderate  Accelerations: 15x15 Decelerations: Prolonged deceleration @ 2000 down to 70 bpm for 6 minutes Toco:  Irregular pattern, occasional contraction   MAU Course  Procedures None  MDM  Prolonged NST in MAU Confirmed 6/8 BPP in the office today at [facility].  Patient had a prolonged deceleration (6-7 minutes) Reviewed patient with Dr. Shawnie Pons and Dr. Juliene Pina who both are agreeable with admission to labor and delivery for induction of labor.   Assessment and Plan   A:  1. Fetal heart rate decelerations affecting management of mother   2. [redacted] weeks  gestation of pregnancy   3. Indication for care in labor and delivery, antepartum     P: Admit to labor GBS positive Dorisann Frames CNM to admit patient   Duane Lope, NP 10/20/2020 8:28 PM

## 2020-10-20 NOTE — Progress Notes (Signed)
Spoke with MAU provider. Reviewed FHT. Decel reviewed, overall reactive. Proceed with IOL. Pt desiring CNM birth, A.Jones informed, plan slow IOL, watch FHT closely, CEFM needed

## 2020-10-21 ENCOUNTER — Inpatient Hospital Stay (HOSPITAL_COMMUNITY): Payer: BC Managed Care – PPO | Admitting: Anesthesiology

## 2020-10-21 DIAGNOSIS — B951 Streptococcus, group B, as the cause of diseases classified elsewhere: Secondary | ICD-10-CM | POA: Diagnosis present

## 2020-10-21 DIAGNOSIS — E109 Type 1 diabetes mellitus without complications: Secondary | ICD-10-CM | POA: Diagnosis present

## 2020-10-21 LAB — RPR: RPR Ser Ql: NONREACTIVE

## 2020-10-21 MED ORDER — WITCH HAZEL-GLYCERIN EX PADS: 1.0000 "application " | MEDICATED_PAD | CUTANEOUS | Status: DC | PRN

## 2020-10-21 MED ORDER — LACTATED RINGERS IV SOLN: 500.0000 mL | Freq: Once | INTRAVENOUS | Status: DC

## 2020-10-21 MED ORDER — ONDANSETRON HCL 4 MG PO TABS: 4.0000 mg | ORAL_TABLET | ORAL | Status: DC | PRN

## 2020-10-21 MED ORDER — LIDOCAINE HCL (PF) 1 % IJ SOLN
INTRAMUSCULAR | Status: DC | PRN
  Administered 2020-10-21: 5 mL via EPIDURAL
  Administered 2020-10-21: 3 mL via EPIDURAL

## 2020-10-21 MED ORDER — EPHEDRINE 5 MG/ML INJ: 10.0000 mg | INTRAVENOUS | Status: DC | PRN

## 2020-10-21 MED ORDER — PHENYLEPHRINE 40 MCG/ML (10ML) SYRINGE FOR IV PUSH (FOR BLOOD PRESSURE SUPPORT): 80.0000 ug | PREFILLED_SYRINGE | INTRAVENOUS | Status: DC | PRN

## 2020-10-21 MED ORDER — ONDANSETRON HCL 4 MG/2ML IJ SOLN: 4.0000 mg | INTRAMUSCULAR | Status: DC | PRN

## 2020-10-21 MED ORDER — ZOLPIDEM TARTRATE 5 MG PO TABS: 5.0000 mg | ORAL_TABLET | Freq: Every evening | ORAL | Status: DC | PRN

## 2020-10-21 MED ORDER — SIMETHICONE 80 MG PO CHEW: 80.0000 mg | CHEWABLE_TABLET | ORAL | Status: DC | PRN

## 2020-10-21 MED ORDER — PRENATAL MULTIVITAMIN CH: 1.0000 | ORAL_TABLET | Freq: Every day | ORAL | Status: DC

## 2020-10-21 MED ORDER — IBUPROFEN 600 MG PO TABS
600.0000 mg | ORAL_TABLET | Freq: Four times a day (QID) | ORAL | Status: DC
  Administered 2020-10-21 – 2020-10-22 (×5): 600 mg via ORAL
  Filled 2020-10-21 (×6): qty 1

## 2020-10-21 MED ORDER — ACETAMINOPHEN 325 MG PO TABS: 650.0000 mg | ORAL_TABLET | ORAL | Status: DC | PRN

## 2020-10-21 MED ORDER — INSULIN PUMP
SUBCUTANEOUS | Status: DC
  Administered 2020-10-21: 3 via SUBCUTANEOUS
  Administered 2020-10-21 (×2): 8 via SUBCUTANEOUS
  Filled 2020-10-21: qty 1

## 2020-10-21 MED ORDER — OXYTOCIN-SODIUM CHLORIDE 30-0.9 UT/500ML-% IV SOLN
1.0000 m[IU]/min | INTRAVENOUS | Status: DC
  Administered 2020-10-21: 1 m[IU]/min via INTRAVENOUS

## 2020-10-21 MED ORDER — DIPHENHYDRAMINE HCL 50 MG/ML IJ SOLN: 12.5000 mg | INTRAMUSCULAR | Status: DC | PRN

## 2020-10-21 MED ORDER — DIPHENHYDRAMINE HCL 25 MG PO CAPS: 25.0000 mg | ORAL_CAPSULE | Freq: Four times a day (QID) | ORAL | Status: DC | PRN

## 2020-10-21 MED ORDER — PHENYLEPHRINE 40 MCG/ML (10ML) SYRINGE FOR IV PUSH (FOR BLOOD PRESSURE SUPPORT)
80.0000 ug | PREFILLED_SYRINGE | INTRAVENOUS | Status: DC | PRN
  Filled 2020-10-21: qty 10

## 2020-10-21 MED ORDER — TETANUS-DIPHTH-ACELL PERTUSSIS 5-2.5-18.5 LF-MCG/0.5 IM SUSY: 0.5000 mL | PREFILLED_SYRINGE | Freq: Once | INTRAMUSCULAR | Status: DC

## 2020-10-21 MED ORDER — DIBUCAINE (PERIANAL) 1 % EX OINT: 1.0000 "application " | TOPICAL_OINTMENT | CUTANEOUS | Status: DC | PRN

## 2020-10-21 MED ORDER — BENZOCAINE-MENTHOL 20-0.5 % EX AERO
1.0000 "application " | INHALATION_SPRAY | CUTANEOUS | Status: DC | PRN
  Filled 2020-10-21: qty 56

## 2020-10-21 MED ORDER — COCONUT OIL OIL: 1.0000 "application " | TOPICAL_OIL | Status: DC | PRN

## 2020-10-21 MED ORDER — SENNOSIDES-DOCUSATE SODIUM 8.6-50 MG PO TABS: 2.0000 | ORAL_TABLET | Freq: Every day | ORAL | Status: DC

## 2020-10-21 MED ORDER — FENTANYL-BUPIVACAINE-NACL 0.5-0.125-0.9 MG/250ML-% EP SOLN
EPIDURAL | Status: DC | PRN
  Administered 2020-10-21: 12 mL/h via EPIDURAL

## 2020-10-21 MED ORDER — FENTANYL-BUPIVACAINE-NACL 0.5-0.125-0.9 MG/250ML-% EP SOLN
12.0000 mL/h | EPIDURAL | Status: DC | PRN
  Filled 2020-10-21: qty 250

## 2020-10-21 NOTE — Lactation Note (Addendum)
This note was copied from a baby's chart. Lactation Consultation Note  Patient Name: Tammy Pollard [id number] Date: 10/21/2020 Reason for consult: Initial assessment;Mother's request;Term;Maternal endocrine disorder;Other (Comment) (Infant glucose gel 1) Age: 39 hrs  Mom stated offering infant breast when cueing. Infant one low glucose at 4 pm. Mom stated last feeding at 5 pm for 20 min 1 1/2 hr prior to visit.  Mom denied any pain with the latch. Mom nipples are round with no signs of compression or bruising.   Mom has Motif and Medela pump at home.  Mom to call for latch assistance if needed.  Infant resting.   LC reviewed different positions, how to look for signs of milk transfer, flanging out lips during a latch and charting feedings and output on I and O sheet.   Plan 1. To feed based on cues 8-12x in 24hr period no more than 4 hrs without an attempt. Mom to offer both breasts and look for signs of milk transfer.  2. If unable to latch, Mom to offer EBM via spoon. Mom states only getting drops. Mom given manual pump sized with 21 flange. Mom to pre pump before latching if needed offer EBM via spoon if unable to get him to latch. 3. I and O sheet reviewed.  4. LC brochure of inpatient and outpatient services reviewed.  All questions answered at the end of the visit.   Maternal Data Has patient been taught Hand Expression?: Yes Does the patient have breastfeeding experience prior to this delivery?: Yes How long did the patient breastfeed?: 15 months Mom breast and bottle and introduced bottle after returning to work at 3 months. Mom had no hx of low milk supply.  Feeding Mother's Current Feeding Choice: Breast Milk  LATCH Score                    Lactation Tools Discussed/Used Tools: Pump;Flanges Flange Size: 21 Breast pump type: Manual Pump Education: Setup, frequency, and cleaning;Milk Storage Reason for Pumping: increase let down Pumping frequency: pre pump 5-10  min before latching  Interventions Interventions: Breast feeding basics reviewed;Education;Support pillows;Assisted with latch;Position options;Skin to skin;Expressed milk;Breast massage;Hand express;Pre-pump if needed;Hand pump;Breast compression  Discharge Pump: Personal  Consult Status Consult Status: Follow-up Date: 10/22/20 Follow-up type: In-patient    Myron Stankovich  Nicholson-Springer 10/21/2020, 6:44 PM

## 2020-10-21 NOTE — Anesthesia Procedure Notes (Signed)
Epidural Patient location during procedure: OB Start time: 10/21/2020 9:25 AM End time: 10/21/2020 9:49 AM  Staffing Anesthesiologist: Atilano Median, DO Performed: anesthesiologist   Preanesthetic Checklist Completed: patient identified, IV checked, site marked, risks and benefits discussed, surgical consent, monitors and equipment checked, pre-op evaluation and timeout performed  Epidural Patient position: sitting Prep: ChloraPrep Patient monitoring: heart rate, continuous pulse ox and blood pressure Approach: midline Location: L3-L4 Injection technique: LOR saline  Needle:  Needle type: Tuohy  Needle gauge: 17 G Needle length: 9 cm Needle insertion depth: 7 cm Catheter type: closed end flexible Catheter size: 20 Guage Catheter at skin depth: 12 cm Test dose: negative and 1.5% lidocaine  Assessment Events: blood not aspirated, injection not painful, no injection resistance and no paresthesia  Additional Notes Patient identified. Risks/Benefits/Options discussed with patient including but not limited to bleeding, infection, nerve damage, paralysis, failed block, incomplete pain control, headache, blood pressure changes, nausea, vomiting, reactions to medications, itching and postpartum back pain. Confirmed with bedside nurse the patient's most recent platelet count. Confirmed with patient that they are not currently taking any anticoagulation, have any bleeding history or any family history of bleeding disorders. Patient expressed understanding and wished to proceed. All questions were answered. Sterile technique was used throughout the entire procedure. Please see nursing notes for vital signs. Test dose was given through epidural catheter and negative prior to continuing to dose epidural or start infusion. Warning signs of high block given to the patient including shortness of breath, tingling/numbness in hands, complete motor block, or any concerning symptoms with  instructions to call for help. Patient was given instructions on fall risk and not to get out of bed. All questions and concerns addressed with instructions to call with any issues or inadequate analgesia.    Reason for block:procedure for pain

## 2020-10-21 NOTE — Anesthesia Preprocedure Evaluation (Addendum)
Anesthesia Evaluation  Patient identified by MRN, date of birth, ID band Patient awake    Reviewed: Allergy & Precautions, NPO status , Patient's Chart, lab work & pertinent test results  Airway Mallampati: II  TM Distance: >3 FB Neck ROM: Full    Dental  (+) Teeth Intact   Pulmonary neg pulmonary ROS, former smoker,    Pulmonary exam normal        Cardiovascular negative cardio ROS   Rhythm:Regular Rate:Normal     Neuro/Psych  Headaches, negative psych ROS   GI/Hepatic Neg liver ROS, PUD,   Endo/Other  diabetes, Type 1, Insulin Dependent, Oral Hypoglycemic Agents  Renal/GU negative Renal ROS  negative genitourinary   Musculoskeletal negative musculoskeletal ROS (+)   Abdominal (+)  Abdomen: soft. Bowel sounds: normal.  Peds  Hematology negative hematology ROS (+)   Anesthesia Other Findings   Reproductive/Obstetrics                             Anesthesia Physical Anesthesia Plan  ASA: 2  Anesthesia Plan: Epidural   Post-op Pain Management:    Induction:   PONV Risk Score and Plan: 2 and Treatment may vary due to age or medical condition  Airway Management Planned: Natural Airway  Additional Equipment: None  Intra-op Plan:   Post-operative Plan:   Informed Consent: I have reviewed the patients History and Physical, chart, labs and discussed the procedure including the risks, benefits and alternatives for the proposed anesthesia with the patient or authorized representative who has indicated his/her understanding and acceptance.     Dental advisory given  Plan Discussed with:   Anesthesia Plan Comments: (Lab Results      Component                Value               Date                      WBC                      12.7 (H)            10/20/2020                HGB                      14.1                10/20/2020                HCT                      41.0                 10/20/2020                MCV                      89.5                10/20/2020                PLT                      317  10/20/2020           Lab Results      Component                Value               Date                      NA                       142                 12/24/2019                K                        3.9                 12/24/2019                GLUCOSE                  113 (H)             12/24/2019                BUN                      14                  12/24/2019                CREATININE               0.70                12/24/2019          )        Anesthesia Quick Evaluation

## 2020-10-21 NOTE — Progress Notes (Signed)
Changed out the telemetry monitors.  Need charging.  Corded ones applied.  Pt back to bed.

## 2020-10-21 NOTE — Progress Notes (Addendum)
Patient refusing Foley catheter.  Education given.  Still refusing.

## 2020-10-21 NOTE — Lactation Note (Signed)
This note was copied from a baby's chart. Lactation Consultation Note  Patient Name: Tammy Pollard [id number] Date: 10/21/2020 Reason for consult: L&D Initial assessment Age:39 hours P2, Mother reports that she breastfed first child for 15 months.  Assist mother with latching infant. Infant latched quickly in cradle hold. Observed infant with sucks and a few swallows with breast compression. Mother sleepy .  Supported infant at the breast. Mother ask questions about how how long on each breast. Discussed cue base feeding. Reviewed hand expression, no obs. Colostrum at present.  Mother informed that mother would have support and LC assistance when she goes to her room .   Maternal Data Has patient been taught Hand Expression?: Yes Does the patient have breastfeeding experience prior to this delivery?: Yes How long did the patient breastfeed?: 15 months with first child  Feeding Mother's Current Feeding Choice: Breast Milk  LATCH Score Latch: Grasps breast easily, tongue down, lips flanged, rhythmical sucking.  Audible Swallowing: Spontaneous and intermittent  Type of Nipple: Everted at rest and after stimulation  Comfort (Breast/Nipple): Soft / non-tender  Hold (Positioning): Full assist, staff holds infant at breast  LATCH Score: 8   Lactation Tools Discussed/Used    Interventions Interventions: Breast feeding basics reviewed;Assisted with latch;Skin to skin;Hand express;Breast compression;Adjust position;Support pillows;Position options;Expressed milk  Discharge    Consult Status Consult Status: Follow-up Date: 10/21/20 Follow-up type: In-patient    Stevan Born [facility] 10/21/2020, 11:40 AM

## 2020-10-21 NOTE — Progress Notes (Signed)
S: Working through contractions with husband, mother, and doula support. Discussed the R/B/A of AROM for induction and patient consents to procedure.   O: Vitals:   10/21/20 0707 10/21/20 0800 10/21/20 0949 10/21/20 1005  BP: 134/87 138/82 132/66 114/60  Pulse: 77 72 90 (!) 57  Resp:  15 16 15   Temp:  98 F (36.7 C)    TempSrc:  Oral    SpO2:    99%  Weight:      Height:       FHT:  FHR: 125 bpm, variability: moderate,  accelerations:  Present,  decelerations:  Present variable decels, good return to baseline. Cat I and II UC:   irregular, every 2-5 minutes SVE:   Dilation: 5 Effacement (%): 80 Station: -2 Exam by:: A Lota Leamer CNM  AROM of a moderate amount of clear fluid at 0901.   A / P: Induction of labor due to non-reassuring fetal testing, s/p Foley balloon, Pitocin off due to Cat II FHT  Fetal Wellbeing:  Category I and Category II Pain Control:  Nitrous Oxide Anticipated MOD:  NSVD  Desires epidural placement. Will reassess SVE and place IUPC for amnioinfusion after comfortable with epidural. Will restart Pitocin if FHT is Cat I.   Dr. [email or web address] updated on patient status and plan of care.   Juliene Pina, CNM, MSN 10/21/2020, 10:11 AM

## 2020-10-21 NOTE — Progress Notes (Addendum)
Inpatient Diabetes Program Recommendations  AACE/ADA: New Consensus Statement on Inpatient Glycemic Control (2015)  Target Ranges:  Prepandial:   less than 140 mg/dL      Peak postprandial:   less than 180 mg/dL (1-2 hours)      Critically ill patients:  140 - 180 mg/dL   Lab Results  Component Value Date   GLUCAP 112 (H) 12/24/2019    Review of Glycemic Control  Diabetes history: Type 1 DM, per endocrinology Outpatient Diabetes medications: Novolog 1:10 CHO, Tresiba 8-9 units QHS, Metformin 500 mg QAM Pre-pregnant :Tresiba 8 units QHS, Novolog 1:8 CHO. Current orders for Inpatient glycemic control: none, CBG Q4H, patient managing with SQ injections using Tresiba and Novolog  IS NOT ON INSULIN PUMP  Inpatient Diabetes Program Recommendations:    Discussed with Marchelle Folks, CNM and Dr Juliene Pina that patient is high risk for hypoglycemia. Currently, self dosing using SQ injections per Dr Juliene Pina and was informed that she would be able to self manage during hospitalization. Unclear if patient aware of labor sensitivity with insulin or if plan available with endocrinology for intra/post partum.  Preparing for epidural, not appropriate to speak with patient. Will follow.   Addendum @1340 : [facility] to [facility] now delivered. CBG per Dexcom 122 mg/dL. Last dose of basal was on 7/28 QHS. Patient sees Dr 8/28 outpatient. Discussion with endocrinologist on typical postpartum dosages following delivery. Plan to go back to Pre-pregnant doses.  Education provided to patient on insulin sensitivity following delivery, increased risk for hypoglycemia and usual dose decreases. Encouraged snack/juice at bedside. Additionally, discussed in the event of blood sugars extremes blood sugars would be checked with hospital meters and team would be contacted for order changes.   Need orders for use of Dexcom/Freestyle Libre, orders for self management of insulin and CBGs Q4H. CNM paged. Discussed with  Mody. Discussed documentation of CBG using Freestyle with RN and needed contract.  Of note: insulin pump order set is not appropriate and usually self management using self injection is not recommended while inpatient. Discussed/orders received with Ronnette Hila, CNM.     Thanks, Marchelle Folks, MSN, RNC-OB Diabetes Coordinator [phone] (8a-5p)

## 2020-10-21 NOTE — Anesthesia Postprocedure Evaluation (Signed)
Anesthesia Post Note  Patient: Tammy Pollard  Procedure(s) Performed: AN AD HOC LABOR EPIDURAL     Patient location during evaluation: Mother Baby Anesthesia Type: Epidural Level of consciousness: awake and alert Pain management: pain level controlled Vital Signs Assessment: post-procedure vital signs reviewed and stable Respiratory status: spontaneous breathing, nonlabored ventilation and respiratory function stable Cardiovascular status: stable Postop Assessment: no headache, no backache and epidural receding Anesthetic complications: no   No notable events documented.  Last Vitals:  Vitals:   10/21/20 1245 10/21/20 1345  BP: 125/76 109/70  Pulse: (!) 53 (!) 50  Resp: 16 16  Temp: 36.8 C 36.6 C  SpO2: 97% 97%    Last Pain:  Vitals:   10/21/20 1345  TempSrc: Oral  PainSc:    Pain Goal:                   Tammy Pollard

## 2020-10-22 LAB — CBC
HCT: 34.7 % — ABNORMAL LOW (ref 36.0–46.0)
Hemoglobin: 11.6 g/dL — ABNORMAL LOW (ref 12.0–15.0)
MCH: 30.4 pg (ref 26.0–34.0)
MCHC: 33.4 g/dL (ref 30.0–36.0)
MCV: 91.1 fL (ref 80.0–100.0)
Platelets: 266 10*3/uL (ref 150–400)
RBC: 3.81 MIL/uL — ABNORMAL LOW (ref 3.87–5.11)
RDW: 13.6 % (ref 11.5–15.5)
WBC: 12.7 10*3/uL — ABNORMAL HIGH (ref 4.0–10.5)
nRBC: 0 % (ref 0.0–0.2)

## 2020-10-22 MED ORDER — ACETAMINOPHEN 325 MG PO TABS: 650.0000 mg | ORAL_TABLET | ORAL | 1 refills | Status: AC | PRN

## 2020-10-22 MED ORDER — MEASLES, MUMPS & RUBELLA VAC IJ SOLR
0.5000 mL | Freq: Once | INTRAMUSCULAR | Status: AC
  Administered 2020-10-22: 0.5 mL via SUBCUTANEOUS
  Filled 2020-10-22: qty 0.5

## 2020-10-22 MED ORDER — IBUPROFEN 600 MG PO TABS: 600.0000 mg | ORAL_TABLET | Freq: Four times a day (QID) | ORAL | 0 refills | Status: AC

## 2020-10-22 NOTE — Lactation Note (Signed)
This note was copied from a baby's chart. Lactation Consultation Note  Patient Name: Tammy Pollard XJOIT'G Date: 10/22/2020 Reason for consult: Follow-up assessment;Early term 37-38.6wks Age:39 hours  LC in to room prior to discharge. Mother reports no concerns with breastfeeding other than some discomfort with latch. Talked about chin tug to get a deeper latch. Mother has personal breast pump at home. Discussed normal behavior and patterns when clusterfeeding. Talked about milk coming into volume.   Plan: 1-Skin to skin 2-Aim for a deep, comfortable latch 3-Breastfeeding on demand or 8-12 times in 24h period. 4-Keep infant awake during breastfeeding session: massaging breast, infant's hand/shoulder/feet 5-Monitor voids and stools as signs good intake.  6-Encouraged maternal rest, hydration and food intake.  7-Contact LC as needed for feeds/support/concerns/questions    All questions answered at this time.   Feeding Mother's Current Feeding Choice: Breast Milk  Interventions Interventions: Breast feeding basics reviewed;Education;Expressed milk  Discharge Discharge Education: Warning signs for feeding baby;Engorgement and breast care Pump: Personal  Consult Status Consult Status: Complete Date: 10/22/20 Follow-up type: Call as needed    Tayquan Gassman A Higuera Ancidey 10/22/2020, 3:21 PM

## 2020-10-22 NOTE — Discharge Summary (Signed)
OB Discharge Summary  Patient Name: Tammy Pollard DOB: 09-12-1980 MRN: [id number]  Date of admission: 10/20/2020 Delivering provider: Dorisann Frames K   Admitting diagnosis: Normal labor and delivery [O80] Intrauterine pregnancy: [redacted]w[redacted]d     Secondary diagnosis: Patient Active Problem List   Diagnosis Date Noted   SVD 7/29 10/21/2020   Postpartum care following vaginal delivery 7/29 10/21/2020   Positive GBS test 10/21/2020   Type 1 diabetes mellitus  10/21/2020   Normal labor and delivery 10/20/2020    Date of discharge: 10/22/2020   Discharge diagnosis: Principal Problem:   Postpartum care following vaginal delivery 7/29 Active Problems:   Normal labor and delivery   SVD 7/29   Positive GBS test   Type 1 diabetes mellitus                                                             Post partum procedures: None  Augmentation: AROM, Pitocin, and IP Foley Pain control: Epidural  Laceration:  Episiotomy:None  Complications: None  Hospital course:  Induction of Labor With Vaginal Delivery   40 y.o. yo G3P1011 at [redacted]w[redacted]d was admitted to the hospital 10/20/2020 for induction of labor.  Indication for induction:  non-reassuring fetal testing .  Patient had an uncomplicated labor course as follows: Membrane Rupture Time/Date: 9:01 AM ,10/21/2020   Delivery Method:Vaginal, Spontaneous  Episiotomy: None  Lacerations:    Details of delivery can be found in separate delivery note. Patient had a routine postpartum course. Patient is discharged home 10/22/20.  Newborn Data: Birth date:10/21/2020  Birth time:10:45 AM  Gender:Female  Living status:Living  Apgars:8 ,9  Weight:3355 g   Physical exam  Vitals:   10/21/20 1735 10/21/20 2100 10/22/20 0100 10/22/20 0500  BP: 135/76 126/72 129/76   Pulse: 65 66 (!) 56 69  Resp: 16 16 16 15   Temp: 97.9 F (36.6 C) 97.7 F (36.5 C) 97.8 F (36.6 C) 98.1 F (36.7 C)  TempSrc: Oral Axillary Oral Oral  SpO2: 97% 98% 98% 97%  Weight:       Height:       General: alert, cooperative, and no distress Lochia: appropriate Uterine Fundus: firm Incision: N/A Perineum: intact DVT Evaluation: No evidence of DVT seen on physical exam. No significant calf/ankle edema. Labs: Lab Results  Component Value Date   WBC 12.7 (H) 10/22/2020   HGB 11.6 (L) 10/22/2020   HCT 34.7 (L) 10/22/2020   MCV 91.1 10/22/2020   PLT 266 10/22/2020   CMP Latest Ref Rng & Units 12/24/2019  Glucose 70 - 99 mg/dL 12/26/2019)  BUN 6 - 20 mg/dL 14  Creatinine 563(J - 4.97 mg/dL 0.26  Sodium 3.78 - 588 mmol/L 142  Potassium 3.5 - 5.1 mmol/L 3.9  Chloride 98 - 111 mmol/L 108   Vaccines: TDaP UTD         COVID-19   UTD  Discharge instructions:  per After Visit Summary  After Visit Meds:  Allergies as of 10/22/2020       Reactions   Sulfa Antibiotics Rash        Medication List     TAKE these medications    acetaminophen 325 MG tablet Commonly known as: Tylenol Take 2 tablets (650 mg total) by mouth every 4 (four) hours as needed (for pain scale <  4).   CitraNatal Harmony 27-1-260 MG Caps Take 1 capsule by mouth daily.   FreeStyle [employer] by Does not apply route.   ibuprofen 600 MG tablet Commonly known as: ADVIL Take 1 tablet (600 mg total) by mouth every 6 (six) hours.   insulin aspart 100 UNIT/ML injection Commonly known as: novoLOG Inject into the skin 3 (three) times daily before meals. Sliding scale 1 unit to 10 carbs   metFORMIN 500 MG tablet Commonly known as: GLUCOPHAGE Take by mouth every evening.   TRESIBA FLEXTOUCH [place] Inject into the skin. 8-9- units qhs       Diet:   Activity: Advance as tolerated. Pelvic rest for 6 weeks.   Newborn Data: Live born female  Birth Weight: 7 lb 6.3 oz (3355 g) APGAR: 8, 9  Newborn Delivery   Birth date/time: 10/21/2020 10:45:00 Delivery type: Vaginal, Spontaneous     Named Bryson Baby Feeding: Breast Disposition:home with mother  Delivery Report:  Review the  Delivery Report for details.    Follow up:  Follow-up Information     June Leap, CNM. Schedule an appointment as soon as possible for a visit in 6 week(s).   Specialty: Certified Nurse Midwife Contact information: [address] [place] [address] [phone]                June Leap, CNM, MSN 10/22/2020, 8:13 AM

## 2021-11-29 ENCOUNTER — Other Ambulatory Visit (HOSPITAL_COMMUNITY): Payer: Self-pay

## 2021-11-29 MED ORDER — OMNIPOD 5 DEXG7G6 PODS GEN 5 MISC
11 refills | Status: DC
  Filled 2021-11-29: qty 10, 30d supply, fill #0
  Filled 2021-12-19: qty 10, 30d supply, fill #1
  Filled 2022-01-09 – 2022-01-29 (×2): qty 10, 30d supply, fill #2
  Filled 2022-02-19 – 2022-02-22 (×2): qty 10, 30d supply, fill #3
  Filled 2022-04-06: qty 10, 30d supply, fill #4
  Filled 2022-04-30: qty 10, 30d supply, fill #5
  Filled 2022-05-29 – 2022-06-08 (×2): qty 10, 30d supply, fill #6
  Filled 2022-06-29: qty 10, 30d supply, fill #7
  Filled 2022-08-02: qty 10, 30d supply, fill #8
  Filled 2022-08-27 – 2022-08-28 (×2): qty 10, 30d supply, fill #9
  Filled 2022-09-25: qty 10, 30d supply, fill #10
  Filled 2022-10-25: qty 10, 30d supply, fill #11

## 2021-11-29 MED ORDER — METFORMIN HCL ER 500 MG PO TB24
ORAL_TABLET | ORAL | 1 refills | Status: AC
  Filled 2021-11-29: qty 90, 90d supply, fill #0

## 2021-11-29 MED ORDER — DEXCOM G6 SENSOR MISC
1 refills | Status: AC
  Filled 2021-11-29: qty 3, 30d supply, fill #0
  Filled 2021-12-26 – 2021-12-29 (×2): qty 3, 30d supply, fill #1
  Filled 2022-01-09 – 2022-01-16 (×2): qty 3, 30d supply, fill #2
  Filled 2022-02-19 – 2022-02-22 (×2): qty 3, 30d supply, fill #3
  Filled 2022-03-21 – 2022-06-20 (×2): qty 3, 30d supply, fill #4
  Filled 2022-08-02: qty 3, 30d supply, fill #5

## 2021-11-30 ENCOUNTER — Other Ambulatory Visit (HOSPITAL_COMMUNITY): Payer: Self-pay

## 2021-11-30 MED ORDER — CICLOPIROX 8 % EX SOLN: CUTANEOUS | 3 refills | Status: AC

## 2021-11-30 MED ORDER — DEXCOM G6 TRANSMITTER MISC
3 refills | Status: DC
  Filled 2021-11-30 – 2021-12-19 (×2): qty 1, 90d supply, fill #0
  Filled 2022-03-28 – 2022-07-12 (×3): qty 1, 90d supply, fill #1
  Filled 2022-10-04 – 2022-10-09 (×3): qty 1, 90d supply, fill #2

## 2021-11-30 MED ORDER — DEXCOM G6 TRANSMITTER MISC: 0 refills | Status: AC

## 2021-11-30 MED ORDER — FIASP 100 UNIT/ML IJ SOLN
INTRAMUSCULAR | 0 refills | Status: AC
  Filled 2022-01-09: qty 90, 30d supply, fill #0
  Filled 2022-01-15: qty 90, 90d supply, fill #0
  Filled 2022-02-08: qty 10, 33d supply, fill #0
  Filled 2022-02-19: qty 10, 30d supply, fill #0
  Filled 2022-03-21 – 2022-04-06 (×2): qty 10, 30d supply, fill #1
  Filled 2022-04-30: qty 10, 30d supply, fill #2
  Filled 2022-05-15 – 2022-06-08 (×3): qty 10, 30d supply, fill #3

## 2021-11-30 MED ORDER — LYUMJEV 100 UNIT/ML IJ SOLN
INTRAMUSCULAR | 4 refills | Status: AC
  Filled 2021-12-19: qty 10, 20d supply, fill #0
  Filled 2022-01-09: qty 10, 20d supply, fill #1
  Filled 2022-01-29: qty 10, 20d supply, fill #2

## 2021-11-30 MED ORDER — METFORMIN HCL ER 500 MG PO TB24
500.0000 mg | ORAL_TABLET | Freq: Every day | ORAL | 0 refills | Status: DC
  Filled 2022-02-19: qty 90, 90d supply, fill #0
  Filled 2022-07-12: qty 90, 90d supply, fill #1

## 2021-11-30 MED ORDER — BAQSIMI ONE PACK 3 MG/DOSE NA POWD: NASAL | 3 refills | Status: AC

## 2021-12-01 ENCOUNTER — Other Ambulatory Visit (HOSPITAL_COMMUNITY): Payer: Self-pay

## 2021-12-06 ENCOUNTER — Other Ambulatory Visit (HOSPITAL_COMMUNITY): Payer: Self-pay

## 2021-12-06 MED ORDER — ACCU-CHEK GUIDE VI STRP: ORAL_STRIP | 2 refills | Status: AC

## 2021-12-06 MED ORDER — OMNIPOD DASH PODS (GEN 4) MISC: 0 refills | Status: DC

## 2021-12-06 MED ORDER — OMNIPOD 5 DEXG7G6 PODS GEN 5 MISC: 11 refills | Status: DC

## 2021-12-06 MED ORDER — INSULIN DEGLUDEC 100 UNIT/ML ~~LOC~~ SOPN
PEN_INJECTOR | SUBCUTANEOUS | 0 refills | Status: AC
  Filled 2021-12-06: qty 9, 90d supply, fill #0

## 2021-12-06 MED ORDER — INSULIN ASPART 100 UNIT/ML FLEXPEN: PEN_INJECTOR | SUBCUTANEOUS | 0 refills | Status: AC

## 2021-12-06 MED ORDER — TRESIBA FLEXTOUCH 100 UNIT/ML ~~LOC~~ SOPN: PEN_INJECTOR | SUBCUTANEOUS | 3 refills | Status: AC

## 2021-12-19 ENCOUNTER — Other Ambulatory Visit (HOSPITAL_COMMUNITY): Payer: Self-pay

## 2021-12-23 ENCOUNTER — Other Ambulatory Visit (HOSPITAL_COMMUNITY): Payer: Self-pay

## 2021-12-25 ENCOUNTER — Other Ambulatory Visit (HOSPITAL_COMMUNITY): Payer: Self-pay

## 2021-12-26 ENCOUNTER — Other Ambulatory Visit (HOSPITAL_COMMUNITY): Payer: Self-pay

## 2021-12-28 ENCOUNTER — Other Ambulatory Visit (HOSPITAL_COMMUNITY): Payer: Self-pay

## 2021-12-30 ENCOUNTER — Other Ambulatory Visit (HOSPITAL_COMMUNITY): Payer: Self-pay

## 2022-01-09 ENCOUNTER — Other Ambulatory Visit (HOSPITAL_COMMUNITY): Payer: Self-pay

## 2022-01-10 ENCOUNTER — Other Ambulatory Visit (HOSPITAL_COMMUNITY): Payer: Self-pay

## 2022-01-15 ENCOUNTER — Other Ambulatory Visit (HOSPITAL_COMMUNITY): Payer: Self-pay

## 2022-01-16 ENCOUNTER — Other Ambulatory Visit (HOSPITAL_COMMUNITY): Payer: Self-pay

## 2022-01-22 ENCOUNTER — Other Ambulatory Visit (HOSPITAL_COMMUNITY): Payer: Self-pay

## 2022-01-29 ENCOUNTER — Other Ambulatory Visit (HOSPITAL_COMMUNITY): Payer: Self-pay

## 2022-01-29 ENCOUNTER — Other Ambulatory Visit: Payer: Self-pay

## 2022-01-31 ENCOUNTER — Other Ambulatory Visit (HOSPITAL_COMMUNITY): Payer: Self-pay

## 2022-02-01 ENCOUNTER — Other Ambulatory Visit: Payer: Self-pay | Admitting: Family Medicine

## 2022-02-01 ENCOUNTER — Other Ambulatory Visit (HOSPITAL_COMMUNITY): Payer: Self-pay

## 2022-02-01 MED ORDER — AMOXICILLIN 875 MG PO TABS
875.0000 mg | ORAL_TABLET | Freq: Two times a day (BID) | ORAL | 0 refills | Status: AC
  Filled 2022-02-01: qty 20, 10d supply, fill #0

## 2022-02-01 MED ORDER — AMOXICILLIN 875 MG PO TABS: 875.0000 mg | ORAL_TABLET | Freq: Two times a day (BID) | ORAL | 0 refills | Status: DC

## 2022-02-01 NOTE — Progress Notes (Signed)
Patient tested positive for strep, will send in amoxicillin 875 mg pobid for 10 days. [place]tr has strep as well.

## 2022-02-02 ENCOUNTER — Other Ambulatory Visit (HOSPITAL_COMMUNITY): Payer: Self-pay

## 2022-02-02 MED ORDER — HYDROQUINONE 4 % EX CREA
1.0000 | TOPICAL_CREAM | Freq: Every evening | CUTANEOUS | 11 refills | Status: AC
  Filled 2022-02-02: qty 28.4, 30d supply, fill #0

## 2022-02-07 ENCOUNTER — Other Ambulatory Visit (HOSPITAL_COMMUNITY): Payer: Self-pay

## 2022-02-07 MED ORDER — FLUCONAZOLE 150 MG PO TABS
150.0000 mg | ORAL_TABLET | Freq: Every day | ORAL | 0 refills | Status: AC
  Filled 2022-02-07: qty 3, 3d supply, fill #0

## 2022-02-08 ENCOUNTER — Other Ambulatory Visit (HOSPITAL_COMMUNITY): Payer: Self-pay

## 2022-02-19 ENCOUNTER — Other Ambulatory Visit (HOSPITAL_COMMUNITY): Payer: Self-pay

## 2022-02-20 ENCOUNTER — Other Ambulatory Visit (HOSPITAL_COMMUNITY): Payer: Self-pay

## 2022-02-21 ENCOUNTER — Other Ambulatory Visit (HOSPITAL_COMMUNITY): Payer: Self-pay

## 2022-02-21 ENCOUNTER — Other Ambulatory Visit: Payer: Self-pay

## 2022-02-22 ENCOUNTER — Other Ambulatory Visit (HOSPITAL_COMMUNITY): Payer: Self-pay

## 2022-02-23 ENCOUNTER — Other Ambulatory Visit (HOSPITAL_COMMUNITY): Payer: Self-pay

## 2022-03-12 ENCOUNTER — Other Ambulatory Visit (HOSPITAL_COMMUNITY): Payer: Self-pay

## 2022-03-12 MED ORDER — FREESTYLE LANCETS MISC
0 refills | Status: AC
  Filled 2022-03-12 – 2022-04-06 (×3): qty 100, 50d supply, fill #0

## 2022-03-12 MED ORDER — GLUCOSE BLOOD VI STRP
ORAL_STRIP | 0 refills | Status: AC
  Filled 2022-03-12 – 2022-04-06 (×4): qty 100, 50d supply, fill #0

## 2022-03-12 MED ORDER — FREESTYLE LITE W/DEVICE KIT
PACK | 0 refills | Status: AC
  Filled 2022-03-12 – 2022-04-06 (×3): qty 1, 30d supply, fill #0

## 2022-03-21 ENCOUNTER — Other Ambulatory Visit (HOSPITAL_COMMUNITY): Payer: Self-pay

## 2022-03-22 ENCOUNTER — Other Ambulatory Visit (HOSPITAL_COMMUNITY): Payer: Self-pay

## 2022-03-22 ENCOUNTER — Other Ambulatory Visit: Payer: Self-pay

## 2022-03-23 ENCOUNTER — Other Ambulatory Visit (HOSPITAL_COMMUNITY): Payer: Self-pay

## 2022-03-28 ENCOUNTER — Other Ambulatory Visit: Payer: Self-pay

## 2022-03-31 ENCOUNTER — Other Ambulatory Visit (HOSPITAL_COMMUNITY): Payer: Self-pay

## 2022-04-04 ENCOUNTER — Other Ambulatory Visit (HOSPITAL_COMMUNITY): Payer: Self-pay

## 2022-04-05 ENCOUNTER — Other Ambulatory Visit (HOSPITAL_COMMUNITY): Payer: Self-pay

## 2022-04-06 ENCOUNTER — Other Ambulatory Visit (HOSPITAL_COMMUNITY): Payer: Self-pay

## 2022-04-12 ENCOUNTER — Other Ambulatory Visit (HOSPITAL_COMMUNITY): Payer: Self-pay

## 2022-04-20 DIAGNOSIS — H52203 Unspecified astigmatism, bilateral: Secondary | ICD-10-CM | POA: Diagnosis not present

## 2022-04-20 LAB — HM DIABETES EYE EXAM

## 2022-04-30 ENCOUNTER — Other Ambulatory Visit: Payer: Self-pay

## 2022-05-01 ENCOUNTER — Other Ambulatory Visit (HOSPITAL_COMMUNITY): Payer: Self-pay

## 2022-05-07 ENCOUNTER — Other Ambulatory Visit (HOSPITAL_COMMUNITY): Payer: Self-pay

## 2022-05-07 MED ORDER — DEXCOM G6 SENSOR MISC
9 refills | Status: DC
  Filled 2022-05-07 – 2022-08-28 (×3): qty 3, 30d supply, fill #0
  Filled 2022-09-25: qty 3, 30d supply, fill #1
  Filled 2022-10-25: qty 3, 30d supply, fill #2
  Filled 2022-11-21: qty 3, 30d supply, fill #3
  Filled 2022-12-21: qty 3, 30d supply, fill #4
  Filled 2023-01-21 – 2023-02-26 (×2): qty 3, 30d supply, fill #5
  Filled 2023-03-27: qty 3, 30d supply, fill #6
  Filled 2023-04-30: qty 3, 30d supply, fill #7

## 2022-05-10 ENCOUNTER — Other Ambulatory Visit (HOSPITAL_COMMUNITY): Payer: Self-pay

## 2022-05-15 ENCOUNTER — Other Ambulatory Visit (HOSPITAL_COMMUNITY): Payer: Self-pay

## 2022-05-15 MED ORDER — AZITHROMYCIN 250 MG PO TABS
ORAL_TABLET | ORAL | 0 refills | Status: AC
  Filled 2022-05-15: qty 6, 5d supply, fill #0

## 2022-05-27 ENCOUNTER — Other Ambulatory Visit (HOSPITAL_COMMUNITY): Payer: Self-pay

## 2022-05-29 ENCOUNTER — Other Ambulatory Visit: Payer: Self-pay

## 2022-05-30 ENCOUNTER — Other Ambulatory Visit: Payer: Self-pay

## 2022-06-02 ENCOUNTER — Other Ambulatory Visit (HOSPITAL_COMMUNITY): Payer: Self-pay

## 2022-06-06 ENCOUNTER — Other Ambulatory Visit (HOSPITAL_COMMUNITY): Payer: Self-pay

## 2022-06-08 ENCOUNTER — Other Ambulatory Visit (HOSPITAL_COMMUNITY): Payer: Self-pay

## 2022-06-15 DIAGNOSIS — E109 Type 1 diabetes mellitus without complications: Secondary | ICD-10-CM | POA: Diagnosis not present

## 2022-06-15 DIAGNOSIS — Z794 Long term (current) use of insulin: Secondary | ICD-10-CM | POA: Diagnosis not present

## 2022-06-15 DIAGNOSIS — E139 Other specified diabetes mellitus without complications: Secondary | ICD-10-CM | POA: Diagnosis not present

## 2022-06-26 ENCOUNTER — Other Ambulatory Visit (HOSPITAL_COMMUNITY): Payer: Self-pay

## 2022-06-29 ENCOUNTER — Other Ambulatory Visit (HOSPITAL_COMMUNITY): Payer: Self-pay

## 2022-07-03 ENCOUNTER — Other Ambulatory Visit: Payer: Self-pay

## 2022-07-13 ENCOUNTER — Other Ambulatory Visit: Payer: Self-pay

## 2022-07-14 ENCOUNTER — Other Ambulatory Visit (HOSPITAL_COMMUNITY): Payer: Self-pay

## 2022-07-16 ENCOUNTER — Other Ambulatory Visit: Payer: Self-pay

## 2022-07-16 ENCOUNTER — Other Ambulatory Visit (HOSPITAL_COMMUNITY): Payer: Self-pay

## 2022-07-16 MED ORDER — CLOTRIMAZOLE-BETAMETHASONE 1-0.05 % EX CREA
1.0000 | TOPICAL_CREAM | Freq: Two times a day (BID) | CUTANEOUS | 1 refills | Status: AC | PRN
  Filled 2022-07-16: qty 30, 15d supply, fill #0

## 2022-07-23 ENCOUNTER — Other Ambulatory Visit (HOSPITAL_COMMUNITY): Payer: Self-pay

## 2022-07-23 MED ORDER — NYSTATIN 100000 UNIT/ML MT SUSP
5.0000 mL | Freq: Three times a day (TID) | OROMUCOSAL | 99 refills | Status: AC
  Filled 2022-07-23: qty 240, 16d supply, fill #0
  Filled 2022-09-24: qty 240, 16d supply, fill #1
  Filled 2022-10-07: qty 240, 16d supply, fill #2

## 2022-07-23 MED ORDER — NYSTATIN 100000 UNIT/ML MT SUSP
OROMUCOSAL | 0 refills | Status: AC
  Filled 2022-07-23: qty 224, 14d supply, fill #0
  Filled 2022-07-23: qty 224, 10d supply, fill #0
  Filled 2022-07-23: qty 224, 14d supply, fill #0

## 2022-07-24 ENCOUNTER — Other Ambulatory Visit (HOSPITAL_COMMUNITY): Payer: Self-pay

## 2022-07-30 ENCOUNTER — Other Ambulatory Visit (HOSPITAL_COMMUNITY): Payer: Self-pay

## 2022-07-31 ENCOUNTER — Other Ambulatory Visit (HOSPITAL_COMMUNITY): Payer: Self-pay

## 2022-07-31 ENCOUNTER — Other Ambulatory Visit: Payer: Self-pay

## 2022-07-31 MED ORDER — FIASP FLEXTOUCH 100 UNIT/ML ~~LOC~~ SOPN
6.0000 [IU] | PEN_INJECTOR | SUBCUTANEOUS | 0 refills | Status: DC
  Filled 2022-07-31: qty 6, 20d supply, fill #0

## 2022-08-02 ENCOUNTER — Other Ambulatory Visit (HOSPITAL_COMMUNITY): Payer: Self-pay

## 2022-08-02 ENCOUNTER — Other Ambulatory Visit: Payer: Self-pay

## 2022-08-02 MED ORDER — FIASP 100 UNIT/ML IJ SOLN
INTRAMUSCULAR | 3 refills | Status: DC
  Filled 2022-08-02: qty 10, fill #0

## 2022-08-02 MED ORDER — FIASP 100 UNIT/ML IJ SOLN
INTRAMUSCULAR | 1 refills | Status: DC
  Filled 2022-08-02: qty 10, 30d supply, fill #0
  Filled 2022-08-22: qty 10, 30d supply, fill #1

## 2022-08-03 ENCOUNTER — Other Ambulatory Visit (HOSPITAL_COMMUNITY): Payer: Self-pay

## 2022-08-06 ENCOUNTER — Other Ambulatory Visit: Payer: Self-pay

## 2022-08-22 ENCOUNTER — Other Ambulatory Visit (HOSPITAL_COMMUNITY): Payer: Self-pay

## 2022-08-28 ENCOUNTER — Other Ambulatory Visit (HOSPITAL_COMMUNITY): Payer: Self-pay

## 2022-08-29 ENCOUNTER — Other Ambulatory Visit (HOSPITAL_COMMUNITY): Payer: Self-pay

## 2022-09-12 ENCOUNTER — Other Ambulatory Visit (HOSPITAL_COMMUNITY): Payer: Self-pay

## 2022-09-13 ENCOUNTER — Other Ambulatory Visit (HOSPITAL_COMMUNITY): Payer: Self-pay

## 2022-09-13 MED ORDER — FIASP 100 UNIT/ML IJ SOLN
INTRAMUSCULAR | 5 refills | Status: DC
  Filled ????-??-??: fill #0

## 2022-09-13 MED ORDER — FIASP 100 UNIT/ML IJ SOLN
INTRAMUSCULAR | 5 refills | Status: AC
  Filled 2022-09-13: qty 30, 90d supply, fill #0
  Filled 2022-09-19: qty 30, fill #0
  Filled 2022-09-24: qty 30, 90d supply, fill #0
  Filled 2022-12-05: qty 30, 90d supply, fill #1
  Filled 2023-03-20 – 2023-04-06 (×2): qty 30, 90d supply, fill #2
  Filled 2023-07-04: qty 30, 90d supply, fill #3

## 2022-09-14 ENCOUNTER — Other Ambulatory Visit (HOSPITAL_COMMUNITY): Payer: Self-pay

## 2022-09-19 ENCOUNTER — Other Ambulatory Visit: Payer: Self-pay

## 2022-09-19 ENCOUNTER — Other Ambulatory Visit (HOSPITAL_COMMUNITY): Payer: Self-pay

## 2022-09-25 ENCOUNTER — Other Ambulatory Visit: Payer: Self-pay

## 2022-09-25 ENCOUNTER — Other Ambulatory Visit (HOSPITAL_COMMUNITY): Payer: Self-pay

## 2022-09-26 ENCOUNTER — Other Ambulatory Visit (HOSPITAL_COMMUNITY): Payer: Self-pay

## 2022-10-03 DIAGNOSIS — Z113 Encounter for screening for infections with a predominantly sexual mode of transmission: Secondary | ICD-10-CM | POA: Diagnosis not present

## 2022-10-03 DIAGNOSIS — Z3200 Encounter for pregnancy test, result unknown: Secondary | ICD-10-CM | POA: Diagnosis not present

## 2022-10-03 DIAGNOSIS — Z114 Encounter for screening for human immunodeficiency virus [HIV]: Secondary | ICD-10-CM | POA: Diagnosis not present

## 2022-10-03 DIAGNOSIS — Z1159 Encounter for screening for other viral diseases: Secondary | ICD-10-CM | POA: Diagnosis not present

## 2022-10-03 DIAGNOSIS — N926 Irregular menstruation, unspecified: Secondary | ICD-10-CM | POA: Diagnosis not present

## 2022-10-03 DIAGNOSIS — N939 Abnormal uterine and vaginal bleeding, unspecified: Secondary | ICD-10-CM | POA: Diagnosis not present

## 2022-10-03 DIAGNOSIS — Z118 Encounter for screening for other infectious and parasitic diseases: Secondary | ICD-10-CM | POA: Diagnosis not present

## 2022-10-04 ENCOUNTER — Other Ambulatory Visit (HOSPITAL_COMMUNITY): Payer: Self-pay

## 2022-10-04 MED ORDER — NORETHINDRONE ACETATE 5 MG PO TABS
ORAL_TABLET | ORAL | 0 refills | Status: DC
  Filled 2022-10-04: qty 10, 5d supply, fill #0

## 2022-10-05 ENCOUNTER — Other Ambulatory Visit (HOSPITAL_COMMUNITY): Payer: Self-pay

## 2022-10-06 ENCOUNTER — Other Ambulatory Visit (HOSPITAL_COMMUNITY): Payer: Self-pay

## 2022-10-08 ENCOUNTER — Other Ambulatory Visit (HOSPITAL_COMMUNITY): Payer: Self-pay

## 2022-10-09 ENCOUNTER — Other Ambulatory Visit: Payer: Self-pay

## 2022-10-09 ENCOUNTER — Other Ambulatory Visit (HOSPITAL_COMMUNITY): Payer: Self-pay

## 2022-10-09 MED ORDER — NORETHINDRONE ACETATE 5 MG PO TABS
5.0000 mg | ORAL_TABLET | Freq: Two times a day (BID) | ORAL | 0 refills | Status: AC
  Filled 2022-10-09: qty 10, 5d supply, fill #0

## 2022-10-10 ENCOUNTER — Other Ambulatory Visit (HOSPITAL_COMMUNITY): Payer: Self-pay

## 2022-10-10 MED ORDER — VALACYCLOVIR HCL 1 G PO TABS
ORAL_TABLET | ORAL | 0 refills | Status: AC
  Filled 2022-10-10: qty 20, 10d supply, fill #0

## 2022-10-10 MED ORDER — NYSTATIN 100000 UNIT/ML MT SUSP
OROMUCOSAL | 0 refills | Status: AC
  Filled 2022-10-11 – 2022-10-12 (×2): qty 240, fill #0
  Filled 2022-10-13: qty 240, 5d supply, fill #0
  Filled 2022-10-16: qty 240, 8d supply, fill #0

## 2022-10-10 MED ORDER — LIDOCAINE VISCOUS HCL 2 % MT SOLN
OROMUCOSAL | 1 refills | Status: AC
  Filled 2022-10-10: qty 100, 3d supply, fill #0
  Filled 2022-10-11: qty 100, 2d supply, fill #0
  Filled 2022-10-12: qty 100, fill #0
  Filled 2022-10-13: qty 100, 1d supply, fill #0
  Filled 2022-10-15: qty 100, 3d supply, fill #0

## 2022-10-11 ENCOUNTER — Other Ambulatory Visit (HOSPITAL_COMMUNITY): Payer: Self-pay

## 2022-10-12 ENCOUNTER — Other Ambulatory Visit (HOSPITAL_COMMUNITY): Payer: Self-pay

## 2022-10-13 ENCOUNTER — Other Ambulatory Visit (HOSPITAL_COMMUNITY): Payer: Self-pay

## 2022-10-15 ENCOUNTER — Other Ambulatory Visit (HOSPITAL_COMMUNITY): Payer: Self-pay

## 2022-10-16 ENCOUNTER — Other Ambulatory Visit (HOSPITAL_COMMUNITY): Payer: Self-pay

## 2022-10-16 DIAGNOSIS — R197 Diarrhea, unspecified: Secondary | ICD-10-CM | POA: Diagnosis not present

## 2022-10-17 DIAGNOSIS — R197 Diarrhea, unspecified: Secondary | ICD-10-CM | POA: Diagnosis not present

## 2022-10-18 ENCOUNTER — Other Ambulatory Visit (HOSPITAL_COMMUNITY): Payer: Self-pay

## 2022-10-19 ENCOUNTER — Other Ambulatory Visit (HOSPITAL_COMMUNITY): Payer: Self-pay

## 2022-10-19 MED ORDER — METFORMIN HCL ER 500 MG PO TB24
500.0000 mg | ORAL_TABLET | Freq: Every day | ORAL | 3 refills | Status: DC
  Filled 2022-10-19: qty 90, 90d supply, fill #0
  Filled 2023-06-05: qty 90, 90d supply, fill #1

## 2022-10-22 ENCOUNTER — Other Ambulatory Visit (HOSPITAL_COMMUNITY): Payer: Self-pay

## 2022-10-22 MED ORDER — TINIDAZOLE 500 MG PO TABS
2000.0000 mg | ORAL_TABLET | Freq: Every day | ORAL | 0 refills | Status: AC
  Filled 2022-10-22: qty 8, 2d supply, fill #0

## 2022-10-25 ENCOUNTER — Other Ambulatory Visit (HOSPITAL_COMMUNITY): Payer: Self-pay

## 2022-10-26 DIAGNOSIS — E139 Other specified diabetes mellitus without complications: Secondary | ICD-10-CM | POA: Diagnosis not present

## 2022-10-29 ENCOUNTER — Other Ambulatory Visit (HOSPITAL_COMMUNITY): Payer: Self-pay

## 2022-11-21 ENCOUNTER — Other Ambulatory Visit (HOSPITAL_COMMUNITY): Payer: Self-pay

## 2022-11-27 ENCOUNTER — Other Ambulatory Visit (HOSPITAL_COMMUNITY): Payer: Self-pay

## 2022-11-28 ENCOUNTER — Other Ambulatory Visit (HOSPITAL_COMMUNITY): Payer: Self-pay

## 2022-11-28 MED ORDER — OMNIPOD 5 DEXG7G6 PODS GEN 5 MISC
11 refills | Status: AC
  Filled 2022-12-21: qty 10, 30d supply, fill #0
  Filled 2023-01-21: qty 10, 30d supply, fill #1
  Filled 2023-02-20: qty 10, 30d supply, fill #2
  Filled 2023-03-22: qty 10, 30d supply, fill #3
  Filled 2023-05-15 – 2023-09-05 (×2): qty 10, 30d supply, fill #4
  Filled 2023-10-05 – 2023-10-16 (×2): qty 10, 30d supply, fill #5
  Filled 2023-11-05 – 2023-11-20 (×3): qty 10, 30d supply, fill #6

## 2022-11-28 MED ORDER — OMNIPOD 5 DEXG7G6 PODS GEN 5 MISC
11 refills | Status: DC
  Filled 2022-11-28: qty 10, 30d supply, fill #0
  Filled 2023-04-18: qty 10, 30d supply, fill #1
  Filled 2023-05-15: qty 10, 30d supply, fill #2
  Filled 2023-06-06: qty 10, 30d supply, fill #3

## 2022-12-11 ENCOUNTER — Other Ambulatory Visit: Payer: Self-pay

## 2022-12-11 ENCOUNTER — Other Ambulatory Visit (HOSPITAL_COMMUNITY): Payer: Self-pay

## 2022-12-13 ENCOUNTER — Other Ambulatory Visit (HOSPITAL_COMMUNITY): Payer: Self-pay

## 2022-12-21 ENCOUNTER — Other Ambulatory Visit: Payer: Self-pay

## 2022-12-21 ENCOUNTER — Other Ambulatory Visit (HOSPITAL_COMMUNITY): Payer: Self-pay

## 2022-12-24 ENCOUNTER — Other Ambulatory Visit (HOSPITAL_COMMUNITY): Payer: Self-pay

## 2022-12-24 DIAGNOSIS — J189 Pneumonia, unspecified organism: Secondary | ICD-10-CM | POA: Diagnosis not present

## 2022-12-24 DIAGNOSIS — R051 Acute cough: Secondary | ICD-10-CM | POA: Diagnosis not present

## 2022-12-24 DIAGNOSIS — J45909 Unspecified asthma, uncomplicated: Secondary | ICD-10-CM | POA: Diagnosis not present

## 2022-12-24 MED ORDER — FLUCONAZOLE 100 MG PO TABS
100.0000 mg | ORAL_TABLET | ORAL | 0 refills | Status: AC
  Filled 2022-12-24: qty 3, 9d supply, fill #0

## 2022-12-24 MED ORDER — ALBUTEROL SULFATE HFA 108 (90 BASE) MCG/ACT IN AERS
2.0000 | INHALATION_SPRAY | Freq: Four times a day (QID) | RESPIRATORY_TRACT | 3 refills | Status: AC
  Filled 2022-12-24: qty 6.7, 25d supply, fill #0
  Filled 2023-01-12 – 2023-01-25 (×2): qty 6.7, 25d supply, fill #1

## 2022-12-24 MED ORDER — AZITHROMYCIN 250 MG PO TABS
ORAL_TABLET | ORAL | 0 refills | Status: AC
  Filled 2022-12-24: qty 6, 5d supply, fill #0

## 2022-12-25 ENCOUNTER — Other Ambulatory Visit: Payer: Self-pay

## 2022-12-25 ENCOUNTER — Other Ambulatory Visit (HOSPITAL_COMMUNITY): Payer: Self-pay

## 2022-12-25 MED ORDER — ALBUTEROL SULFATE HFA 108 (90 BASE) MCG/ACT IN AERS: 2.0000 | INHALATION_SPRAY | Freq: Four times a day (QID) | RESPIRATORY_TRACT | 0 refills | Status: AC

## 2022-12-27 ENCOUNTER — Other Ambulatory Visit (HOSPITAL_COMMUNITY): Payer: Self-pay

## 2022-12-27 MED ORDER — PREDNISONE 10 MG PO TABS
ORAL_TABLET | ORAL | 0 refills | Status: AC
  Filled 2022-12-27: qty 27, 10d supply, fill #0

## 2022-12-28 ENCOUNTER — Other Ambulatory Visit (HOSPITAL_COMMUNITY): Payer: Self-pay

## 2023-01-12 ENCOUNTER — Other Ambulatory Visit (HOSPITAL_COMMUNITY): Payer: Self-pay

## 2023-01-14 ENCOUNTER — Other Ambulatory Visit (HOSPITAL_COMMUNITY): Payer: Self-pay

## 2023-01-21 ENCOUNTER — Other Ambulatory Visit: Payer: Self-pay

## 2023-01-21 ENCOUNTER — Other Ambulatory Visit (HOSPITAL_COMMUNITY): Payer: Self-pay

## 2023-01-24 ENCOUNTER — Other Ambulatory Visit (HOSPITAL_COMMUNITY): Payer: Self-pay

## 2023-01-28 ENCOUNTER — Other Ambulatory Visit (HOSPITAL_COMMUNITY): Payer: Self-pay

## 2023-02-11 ENCOUNTER — Other Ambulatory Visit (HOSPITAL_COMMUNITY): Payer: Self-pay

## 2023-02-12 ENCOUNTER — Other Ambulatory Visit (HOSPITAL_COMMUNITY): Payer: Self-pay

## 2023-02-12 MED ORDER — DEXCOM G6 TRANSMITTER MISC
3 refills | Status: AC
  Filled 2023-02-12 – 2023-05-15 (×3): qty 1, 90d supply, fill #0
  Filled 2023-08-05: qty 1, 90d supply, fill #1

## 2023-02-13 ENCOUNTER — Other Ambulatory Visit (HOSPITAL_COMMUNITY): Payer: Self-pay

## 2023-02-20 ENCOUNTER — Other Ambulatory Visit: Payer: Self-pay

## 2023-02-27 ENCOUNTER — Other Ambulatory Visit (HOSPITAL_COMMUNITY): Payer: Self-pay

## 2023-03-04 ENCOUNTER — Other Ambulatory Visit (HOSPITAL_COMMUNITY): Payer: Self-pay

## 2023-03-08 ENCOUNTER — Other Ambulatory Visit: Payer: Self-pay

## 2023-03-08 DIAGNOSIS — N6452 Nipple discharge: Secondary | ICD-10-CM

## 2023-03-21 ENCOUNTER — Other Ambulatory Visit: Payer: Self-pay

## 2023-03-22 ENCOUNTER — Other Ambulatory Visit (HOSPITAL_COMMUNITY): Payer: Self-pay

## 2023-03-26 ENCOUNTER — Other Ambulatory Visit (HOSPITAL_COMMUNITY): Payer: Self-pay

## 2023-03-28 ENCOUNTER — Other Ambulatory Visit (HOSPITAL_COMMUNITY): Payer: Self-pay

## 2023-03-28 ENCOUNTER — Other Ambulatory Visit (HOSPITAL_BASED_OUTPATIENT_CLINIC_OR_DEPARTMENT_OTHER): Payer: Self-pay

## 2023-03-28 ENCOUNTER — Other Ambulatory Visit: Payer: Self-pay

## 2023-03-29 ENCOUNTER — Other Ambulatory Visit (HOSPITAL_BASED_OUTPATIENT_CLINIC_OR_DEPARTMENT_OTHER): Payer: Self-pay

## 2023-03-29 ENCOUNTER — Ambulatory Visit
Admission: RE | Admit: 2023-03-29 | Discharge: 2023-03-29 | Disposition: A | Discharge status: Home / Self Care | Payer: PRIVATE HEALTH INSURANCE | Source: Ambulatory Visit

## 2023-03-29 ENCOUNTER — Other Ambulatory Visit: Payer: Self-pay

## 2023-03-29 ENCOUNTER — Ambulatory Visit
Admission: RE | Admit: 2023-03-29 | Discharge: 2023-03-29 | Disposition: A | Discharge status: Home / Self Care | Payer: Commercial Managed Care - PPO | Source: Ambulatory Visit

## 2023-03-29 DIAGNOSIS — N6452 Nipple discharge: Secondary | ICD-10-CM | POA: Diagnosis not present

## 2023-04-01 ENCOUNTER — Other Ambulatory Visit: Payer: Self-pay

## 2023-04-01 DIAGNOSIS — Z0000 Encounter for general adult medical examination without abnormal findings: Secondary | ICD-10-CM | POA: Diagnosis not present

## 2023-04-01 DIAGNOSIS — E785 Hyperlipidemia, unspecified: Secondary | ICD-10-CM | POA: Diagnosis not present

## 2023-04-01 DIAGNOSIS — E139 Other specified diabetes mellitus without complications: Secondary | ICD-10-CM | POA: Diagnosis not present

## 2023-04-02 ENCOUNTER — Other Ambulatory Visit (HOSPITAL_COMMUNITY): Payer: Self-pay

## 2023-04-05 ENCOUNTER — Other Ambulatory Visit (HOSPITAL_COMMUNITY): Payer: Self-pay

## 2023-04-05 DIAGNOSIS — Z0000 Encounter for general adult medical examination without abnormal findings: Secondary | ICD-10-CM | POA: Diagnosis not present

## 2023-04-05 DIAGNOSIS — Z01419 Encounter for gynecological examination (general) (routine) without abnormal findings: Secondary | ICD-10-CM | POA: Diagnosis not present

## 2023-04-06 ENCOUNTER — Other Ambulatory Visit (HOSPITAL_COMMUNITY): Payer: Self-pay

## 2023-04-19 ENCOUNTER — Other Ambulatory Visit: Payer: Self-pay

## 2023-04-25 ENCOUNTER — Other Ambulatory Visit (HOSPITAL_COMMUNITY): Payer: Self-pay

## 2023-04-25 MED ORDER — FREESTYLE LIBRE 2 PLUS SENSOR MISC
3 refills | Status: AC
Start: 2023-04-25 — End: ?
  Filled 2023-04-25 (×2): qty 6, 84d supply, fill #0
  Filled 2023-04-25 – 2023-05-15 (×2): qty 6, 90d supply, fill #0

## 2023-04-26 ENCOUNTER — Other Ambulatory Visit (HOSPITAL_COMMUNITY): Payer: Self-pay

## 2023-04-26 MED ORDER — OMNIPOD 5 DEXG7G6 PODS GEN 5 MISC
11 refills | Status: AC
  Filled 2023-04-26 – 2023-05-14 (×3): qty 10, 30d supply, fill #0
  Filled 2023-06-05: qty 10, fill #0
  Filled 2023-07-07: qty 10, 30d supply, fill #0
  Filled 2023-08-05: qty 10, 30d supply, fill #1
  Filled 2023-09-10 – 2023-12-14 (×5): qty 10, 30d supply, fill #2
  Filled 2024-03-08 – 2024-03-09 (×2): qty 10, 30d supply, fill #3
  Filled 2024-04-05 – 2024-04-08 (×2): qty 10, 30d supply, fill #4

## 2023-04-26 MED ORDER — OMNIPOD 5 LIBRE2 PLUS G6 KIT
PACK | 0 refills | Status: AC
  Filled 2023-04-26 – 2023-04-27 (×2): qty 1, 30d supply, fill #0
  Filled 2023-04-29: qty 1, 14d supply, fill #0
  Filled 2023-04-30 – 2023-05-01 (×2): qty 1, 30d supply, fill #0
  Filled 2023-05-02: qty 1, 14d supply, fill #0
  Filled 2023-05-03: qty 1, 28d supply, fill #0
  Filled 2023-05-04: qty 1, fill #0
  Filled 2023-05-06 – 2023-10-16 (×2): qty 1, 30d supply, fill #0

## 2023-04-27 ENCOUNTER — Other Ambulatory Visit (HOSPITAL_COMMUNITY): Payer: Self-pay

## 2023-04-29 ENCOUNTER — Other Ambulatory Visit (HOSPITAL_COMMUNITY): Payer: Self-pay

## 2023-04-30 ENCOUNTER — Other Ambulatory Visit (HOSPITAL_COMMUNITY): Payer: Self-pay

## 2023-05-01 ENCOUNTER — Other Ambulatory Visit (HOSPITAL_COMMUNITY): Payer: Self-pay

## 2023-05-02 ENCOUNTER — Other Ambulatory Visit (HOSPITAL_COMMUNITY): Payer: Self-pay

## 2023-05-03 ENCOUNTER — Other Ambulatory Visit (HOSPITAL_COMMUNITY): Payer: Self-pay

## 2023-05-03 DIAGNOSIS — E139 Other specified diabetes mellitus without complications: Secondary | ICD-10-CM | POA: Diagnosis not present

## 2023-05-04 ENCOUNTER — Other Ambulatory Visit (HOSPITAL_COMMUNITY): Payer: Self-pay

## 2023-05-06 ENCOUNTER — Other Ambulatory Visit (HOSPITAL_COMMUNITY): Payer: Self-pay

## 2023-05-06 ENCOUNTER — Other Ambulatory Visit: Payer: Self-pay

## 2023-05-15 ENCOUNTER — Other Ambulatory Visit (HOSPITAL_COMMUNITY): Payer: Self-pay

## 2023-05-15 ENCOUNTER — Other Ambulatory Visit: Payer: Self-pay

## 2023-05-24 ENCOUNTER — Other Ambulatory Visit (HOSPITAL_COMMUNITY): Payer: Self-pay

## 2023-05-29 ENCOUNTER — Other Ambulatory Visit (HOSPITAL_COMMUNITY): Payer: Self-pay

## 2023-05-29 MED ORDER — FLUCONAZOLE 150 MG PO TABS
150.0000 mg | ORAL_TABLET | Freq: Every day | ORAL | 0 refills | Status: AC
  Filled 2023-05-29: qty 10, 10d supply, fill #0

## 2023-06-05 ENCOUNTER — Other Ambulatory Visit (HOSPITAL_COMMUNITY): Payer: Self-pay

## 2023-06-06 ENCOUNTER — Other Ambulatory Visit: Payer: Self-pay

## 2023-06-06 ENCOUNTER — Other Ambulatory Visit (HOSPITAL_COMMUNITY): Payer: Self-pay

## 2023-06-11 ENCOUNTER — Other Ambulatory Visit (HOSPITAL_COMMUNITY): Payer: Self-pay

## 2023-06-11 MED ORDER — DEXCOM G6 SENSOR MISC
9 refills | Status: AC
  Filled 2023-06-11: qty 3, 30d supply, fill #0
  Filled 2023-07-07: qty 3, 30d supply, fill #1
  Filled 2023-08-05: qty 3, 30d supply, fill #2

## 2023-06-12 ENCOUNTER — Other Ambulatory Visit (HOSPITAL_COMMUNITY): Payer: Self-pay

## 2023-06-12 MED ORDER — DEXCOM G6 SENSOR MISC
5 refills | Status: AC
Start: 2023-06-12 — End: ?
  Filled 2023-06-12 – 2023-09-10 (×3): qty 3, 30d supply, fill #0

## 2023-06-29 ENCOUNTER — Other Ambulatory Visit (HOSPITAL_COMMUNITY): Payer: Self-pay

## 2023-07-04 ENCOUNTER — Other Ambulatory Visit: Payer: Self-pay

## 2023-07-08 ENCOUNTER — Other Ambulatory Visit (HOSPITAL_COMMUNITY): Payer: Self-pay

## 2023-07-19 DIAGNOSIS — H5213 Myopia, bilateral: Secondary | ICD-10-CM | POA: Diagnosis not present

## 2023-08-06 ENCOUNTER — Other Ambulatory Visit (HOSPITAL_COMMUNITY): Payer: Self-pay

## 2023-08-13 ENCOUNTER — Other Ambulatory Visit (HOSPITAL_COMMUNITY): Payer: Self-pay

## 2023-08-16 ENCOUNTER — Other Ambulatory Visit (HOSPITAL_BASED_OUTPATIENT_CLINIC_OR_DEPARTMENT_OTHER): Payer: Self-pay

## 2023-08-16 ENCOUNTER — Other Ambulatory Visit (HOSPITAL_COMMUNITY): Payer: Self-pay

## 2023-08-16 DIAGNOSIS — K219 Gastro-esophageal reflux disease without esophagitis: Secondary | ICD-10-CM | POA: Diagnosis not present

## 2023-08-16 DIAGNOSIS — R197 Diarrhea, unspecified: Secondary | ICD-10-CM | POA: Diagnosis not present

## 2023-08-16 DIAGNOSIS — J029 Acute pharyngitis, unspecified: Secondary | ICD-10-CM | POA: Diagnosis not present

## 2023-08-16 DIAGNOSIS — Z8619 Personal history of other infectious and parasitic diseases: Secondary | ICD-10-CM | POA: Diagnosis not present

## 2023-08-16 DIAGNOSIS — Z8719 Personal history of other diseases of the digestive system: Secondary | ICD-10-CM | POA: Diagnosis not present

## 2023-08-16 MED ORDER — OMEPRAZOLE 20 MG PO CPDR
20.0000 mg | DELAYED_RELEASE_CAPSULE | ORAL | 1 refills | Status: AC
  Filled 2023-08-16: qty 90, 90d supply, fill #0
  Filled 2023-11-07 – 2024-02-04 (×10): qty 90, 90d supply, fill #1

## 2023-08-21 ENCOUNTER — Other Ambulatory Visit (HOSPITAL_COMMUNITY): Payer: Self-pay

## 2023-08-21 MED ORDER — AMOXICILLIN 500 MG PO CAPS
500.0000 mg | ORAL_CAPSULE | Freq: Three times a day (TID) | ORAL | 0 refills | Status: AC
  Filled 2023-08-21: qty 21, 7d supply, fill #0

## 2023-08-23 ENCOUNTER — Other Ambulatory Visit (HOSPITAL_COMMUNITY): Payer: Self-pay

## 2023-08-23 MED ORDER — DOXYCYCLINE HYCLATE 100 MG PO CAPS
100.0000 mg | ORAL_CAPSULE | Freq: Two times a day (BID) | ORAL | 0 refills | Status: AC
  Filled 2023-08-23: qty 14, 7d supply, fill #0

## 2023-09-05 ENCOUNTER — Other Ambulatory Visit: Payer: Self-pay

## 2023-09-06 ENCOUNTER — Other Ambulatory Visit (HOSPITAL_COMMUNITY): Payer: Self-pay

## 2023-09-10 ENCOUNTER — Other Ambulatory Visit: Payer: Self-pay

## 2023-09-10 ENCOUNTER — Other Ambulatory Visit (HOSPITAL_COMMUNITY): Payer: Self-pay

## 2023-09-10 MED ORDER — CYCLOBENZAPRINE HCL 5 MG PO TABS
5.0000 mg | ORAL_TABLET | Freq: Three times a day (TID) | ORAL | 0 refills | Status: AC | PRN
  Filled 2023-09-10: qty 30, 10d supply, fill #0

## 2023-09-11 ENCOUNTER — Other Ambulatory Visit (HOSPITAL_COMMUNITY): Payer: Self-pay

## 2023-09-11 ENCOUNTER — Other Ambulatory Visit (HOSPITAL_BASED_OUTPATIENT_CLINIC_OR_DEPARTMENT_OTHER): Payer: Self-pay

## 2023-09-11 MED ORDER — DEXCOM G7 SENSOR MISC
3 refills | Status: AC
  Filled 2023-09-11: qty 9, 90d supply, fill #0
  Filled 2023-09-12: qty 3, 30d supply, fill #0
  Filled 2023-09-12 – 2023-11-19 (×4): qty 9, 90d supply, fill #0
  Filled 2024-04-08: qty 3, 30d supply, fill #1

## 2023-09-12 ENCOUNTER — Other Ambulatory Visit (HOSPITAL_COMMUNITY): Payer: Self-pay

## 2023-09-12 ENCOUNTER — Other Ambulatory Visit: Payer: Self-pay

## 2023-09-13 ENCOUNTER — Other Ambulatory Visit: Payer: Self-pay

## 2023-10-05 ENCOUNTER — Other Ambulatory Visit (HOSPITAL_COMMUNITY): Payer: Self-pay

## 2023-10-15 ENCOUNTER — Other Ambulatory Visit (HOSPITAL_COMMUNITY): Payer: Self-pay

## 2023-10-16 ENCOUNTER — Other Ambulatory Visit (HOSPITAL_COMMUNITY): Payer: Self-pay

## 2023-10-16 ENCOUNTER — Other Ambulatory Visit: Payer: Self-pay

## 2023-10-17 ENCOUNTER — Other Ambulatory Visit (HOSPITAL_COMMUNITY): Payer: Self-pay

## 2023-10-21 ENCOUNTER — Other Ambulatory Visit (HOSPITAL_COMMUNITY): Payer: Self-pay

## 2023-10-21 MED ORDER — METFORMIN HCL ER 500 MG PO TB24
500.0000 mg | ORAL_TABLET | Freq: Every day | ORAL | 3 refills | Status: AC
  Filled 2023-10-21: qty 90, 90d supply, fill #0

## 2023-10-27 ENCOUNTER — Encounter (HOSPITAL_COMMUNITY): Payer: Self-pay

## 2023-10-28 ENCOUNTER — Other Ambulatory Visit (HOSPITAL_COMMUNITY): Payer: Self-pay

## 2023-10-28 MED ORDER — FIASP 100 UNIT/ML IJ SOLN
30.0000 [IU] | Freq: Every day | INTRAMUSCULAR | 11 refills | Status: AC
  Filled 2023-10-28: qty 10, 30d supply, fill #0
  Filled 2023-11-05: qty 10, 33d supply, fill #0
  Filled 2023-11-06 – 2023-11-11 (×6): qty 10, fill #0
  Filled 2023-11-12: qty 10, 30d supply, fill #0

## 2023-10-29 ENCOUNTER — Other Ambulatory Visit (HOSPITAL_COMMUNITY): Payer: Self-pay

## 2023-10-29 MED ORDER — FIASP 100 UNIT/ML IJ SOLN
90.0000 [IU] | Freq: Every day | INTRAMUSCULAR | 0 refills | Status: DC
  Filled 2023-10-29: qty 30, 90d supply, fill #0
  Filled 2023-10-30 – 2023-10-31 (×2): qty 30, 100d supply, fill #0
  Filled 2023-11-01: qty 30, fill #0
  Filled 2023-11-02 – 2023-11-04 (×3): qty 30, 90d supply, fill #0

## 2023-10-30 ENCOUNTER — Other Ambulatory Visit (HOSPITAL_COMMUNITY): Payer: Self-pay

## 2023-10-31 ENCOUNTER — Other Ambulatory Visit (HOSPITAL_COMMUNITY): Payer: Self-pay

## 2023-11-01 ENCOUNTER — Other Ambulatory Visit (HOSPITAL_COMMUNITY): Payer: Self-pay

## 2023-11-02 ENCOUNTER — Other Ambulatory Visit (HOSPITAL_COMMUNITY): Payer: Self-pay

## 2023-11-04 ENCOUNTER — Other Ambulatory Visit (HOSPITAL_COMMUNITY): Payer: Self-pay

## 2023-11-05 ENCOUNTER — Other Ambulatory Visit (HOSPITAL_COMMUNITY): Payer: Self-pay

## 2023-11-06 ENCOUNTER — Other Ambulatory Visit (HOSPITAL_COMMUNITY): Payer: Self-pay

## 2023-11-07 ENCOUNTER — Other Ambulatory Visit (HOSPITAL_COMMUNITY): Payer: Self-pay

## 2023-11-08 ENCOUNTER — Other Ambulatory Visit (HOSPITAL_COMMUNITY): Payer: Self-pay

## 2023-11-08 DIAGNOSIS — E139 Other specified diabetes mellitus without complications: Secondary | ICD-10-CM | POA: Diagnosis not present

## 2023-11-09 ENCOUNTER — Other Ambulatory Visit (HOSPITAL_COMMUNITY): Payer: Self-pay

## 2023-11-11 ENCOUNTER — Other Ambulatory Visit (HOSPITAL_COMMUNITY): Payer: Self-pay

## 2023-11-12 ENCOUNTER — Other Ambulatory Visit (HOSPITAL_COMMUNITY): Payer: Self-pay

## 2023-11-13 ENCOUNTER — Other Ambulatory Visit (HOSPITAL_COMMUNITY): Payer: Self-pay

## 2023-11-18 ENCOUNTER — Other Ambulatory Visit (HOSPITAL_COMMUNITY): Payer: Self-pay

## 2023-11-19 ENCOUNTER — Other Ambulatory Visit (HOSPITAL_COMMUNITY): Payer: Self-pay

## 2023-11-20 ENCOUNTER — Other Ambulatory Visit (HOSPITAL_COMMUNITY): Payer: Self-pay

## 2023-11-29 ENCOUNTER — Other Ambulatory Visit (HOSPITAL_COMMUNITY): Payer: Self-pay

## 2023-12-10 ENCOUNTER — Other Ambulatory Visit (HOSPITAL_COMMUNITY): Payer: Self-pay

## 2023-12-13 ENCOUNTER — Other Ambulatory Visit (HOSPITAL_COMMUNITY): Payer: Self-pay

## 2023-12-14 ENCOUNTER — Other Ambulatory Visit (HOSPITAL_COMMUNITY): Payer: Self-pay

## 2023-12-16 ENCOUNTER — Other Ambulatory Visit (HOSPITAL_COMMUNITY): Payer: Self-pay

## 2023-12-27 ENCOUNTER — Other Ambulatory Visit (HOSPITAL_COMMUNITY): Payer: Self-pay

## 2023-12-31 ENCOUNTER — Other Ambulatory Visit (HOSPITAL_COMMUNITY): Payer: Self-pay

## 2023-12-31 ENCOUNTER — Other Ambulatory Visit: Payer: Self-pay

## 2023-12-31 MED ORDER — OMEPRAZOLE 20 MG PO CPDR
20.0000 mg | DELAYED_RELEASE_CAPSULE | Freq: Every day | ORAL | 1 refills | Status: AC
  Filled 2023-12-31 – 2024-01-01 (×2): qty 90, 90d supply, fill #0
  Filled 2024-01-02: qty 90, fill #0
  Filled 2024-01-03 – 2024-01-05 (×3): qty 90, 90d supply, fill #0

## 2023-12-31 MED ORDER — DEXCOM G7 SENSOR MISC
1 refills | Status: AC
  Filled 2023-12-31 – 2024-03-09 (×2): qty 9, 90d supply, fill #0

## 2023-12-31 MED ORDER — METFORMIN HCL ER 500 MG PO TB24
500.0000 mg | ORAL_TABLET | Freq: Every day | ORAL | 1 refills | Status: AC
  Filled 2023-12-31 – 2024-01-13 (×2): qty 90, 90d supply, fill #0
  Filled 2024-04-05: qty 90, 90d supply, fill #1

## 2023-12-31 MED ORDER — OMNIPOD 5 G7 PODS (GEN 5) MISC
1 refills | Status: AC
  Filled 2023-12-31: qty 30, 90d supply, fill #0
  Filled 2024-01-01 – 2024-01-02 (×2): qty 30, fill #0
  Filled 2024-01-03 – 2024-04-08 (×8): qty 30, 90d supply, fill #0

## 2023-12-31 MED ORDER — FIASP 100 UNIT/ML IJ SOLN
90.0000 [IU] | INTRAMUSCULAR | 1 refills | Status: AC
  Filled 2024-01-01 – 2024-01-05 (×5): qty 90, fill #0
  Filled 2024-01-06: qty 90, 90d supply, fill #0
  Filled 2024-01-15 – 2024-01-28 (×2): qty 30, 90d supply, fill #0
  Filled 2024-02-09: qty 30, 30d supply, fill #0
  Filled 2024-02-12 (×2): qty 30, 90d supply, fill #0

## 2024-01-01 ENCOUNTER — Other Ambulatory Visit (HOSPITAL_COMMUNITY): Payer: Self-pay

## 2024-01-02 ENCOUNTER — Other Ambulatory Visit (HOSPITAL_COMMUNITY): Payer: Self-pay

## 2024-01-02 DIAGNOSIS — K219 Gastro-esophageal reflux disease without esophagitis: Secondary | ICD-10-CM | POA: Diagnosis not present

## 2024-01-02 DIAGNOSIS — R195 Other fecal abnormalities: Secondary | ICD-10-CM | POA: Diagnosis not present

## 2024-01-02 DIAGNOSIS — Z9049 Acquired absence of other specified parts of digestive tract: Secondary | ICD-10-CM | POA: Diagnosis not present

## 2024-01-02 MED ORDER — COLESTIPOL HCL 1 G PO TABS
1.0000 g | ORAL_TABLET | Freq: Two times a day (BID) | ORAL | 3 refills | Status: AC
  Filled 2024-01-02 – 2024-01-13 (×2): qty 60, 30d supply, fill #0
  Filled 2024-02-05: qty 60, 30d supply, fill #1

## 2024-01-03 ENCOUNTER — Other Ambulatory Visit (HOSPITAL_COMMUNITY): Payer: Self-pay

## 2024-01-04 ENCOUNTER — Other Ambulatory Visit (HOSPITAL_COMMUNITY): Payer: Self-pay

## 2024-01-05 ENCOUNTER — Other Ambulatory Visit (HOSPITAL_COMMUNITY): Payer: Self-pay

## 2024-01-06 ENCOUNTER — Other Ambulatory Visit (HOSPITAL_COMMUNITY): Payer: Self-pay

## 2024-01-07 ENCOUNTER — Other Ambulatory Visit (HOSPITAL_COMMUNITY): Payer: Self-pay

## 2024-01-07 MED ORDER — OMNIPOD 5 DEXG7G6 PODS GEN 5 MISC
11 refills | Status: AC
  Filled 2024-01-07: qty 10, 30d supply, fill #0
  Filled 2024-02-10: qty 10, 30d supply, fill #1

## 2024-01-12 ENCOUNTER — Other Ambulatory Visit (HOSPITAL_COMMUNITY): Payer: Self-pay

## 2024-01-13 ENCOUNTER — Other Ambulatory Visit: Payer: Self-pay

## 2024-01-13 ENCOUNTER — Other Ambulatory Visit (HOSPITAL_COMMUNITY): Payer: Self-pay

## 2024-01-15 ENCOUNTER — Other Ambulatory Visit (HOSPITAL_COMMUNITY): Payer: Self-pay

## 2024-01-16 ENCOUNTER — Other Ambulatory Visit (HOSPITAL_COMMUNITY): Payer: Self-pay

## 2024-01-24 ENCOUNTER — Other Ambulatory Visit (HOSPITAL_COMMUNITY): Payer: Self-pay

## 2024-01-27 ENCOUNTER — Other Ambulatory Visit (HOSPITAL_COMMUNITY): Payer: Self-pay

## 2024-01-28 ENCOUNTER — Other Ambulatory Visit (HOSPITAL_COMMUNITY): Payer: Self-pay

## 2024-02-03 ENCOUNTER — Other Ambulatory Visit (HOSPITAL_COMMUNITY): Payer: Self-pay

## 2024-02-05 ENCOUNTER — Other Ambulatory Visit (HOSPITAL_COMMUNITY): Payer: Self-pay

## 2024-02-06 ENCOUNTER — Other Ambulatory Visit (HOSPITAL_COMMUNITY): Payer: Self-pay

## 2024-02-08 ENCOUNTER — Other Ambulatory Visit (HOSPITAL_COMMUNITY): Payer: Self-pay

## 2024-02-09 ENCOUNTER — Other Ambulatory Visit (HOSPITAL_COMMUNITY): Payer: Self-pay

## 2024-02-11 ENCOUNTER — Other Ambulatory Visit (HOSPITAL_COMMUNITY): Payer: Self-pay

## 2024-02-12 ENCOUNTER — Other Ambulatory Visit (HOSPITAL_COMMUNITY): Payer: Self-pay

## 2024-02-12 MED ORDER — FIASP 100 UNIT/ML IJ SOLN
90.0000 [IU] | INTRAMUSCULAR | 1 refills | Status: AC
  Filled 2024-02-12: qty 90, 90d supply, fill #0
  Filled 2024-02-13: qty 90, fill #0
  Filled 2024-02-14: qty 90, 30d supply, fill #0
  Filled 2024-02-15: qty 90, 90d supply, fill #0
  Filled 2024-03-09 – 2024-03-27 (×2): qty 30, 90d supply, fill #0

## 2024-02-13 ENCOUNTER — Other Ambulatory Visit (HOSPITAL_COMMUNITY): Payer: Self-pay

## 2024-02-14 ENCOUNTER — Other Ambulatory Visit (HOSPITAL_COMMUNITY): Payer: Self-pay

## 2024-02-15 ENCOUNTER — Other Ambulatory Visit (HOSPITAL_COMMUNITY): Payer: Self-pay

## 2024-02-21 ENCOUNTER — Other Ambulatory Visit (HOSPITAL_COMMUNITY): Payer: Self-pay

## 2024-03-09 ENCOUNTER — Other Ambulatory Visit: Payer: Self-pay

## 2024-03-09 ENCOUNTER — Other Ambulatory Visit (HOSPITAL_COMMUNITY): Payer: Self-pay

## 2024-03-10 ENCOUNTER — Other Ambulatory Visit (HOSPITAL_COMMUNITY): Payer: Self-pay

## 2024-03-10 ENCOUNTER — Encounter (HOSPITAL_COMMUNITY): Payer: Self-pay

## 2024-03-27 ENCOUNTER — Other Ambulatory Visit (HOSPITAL_COMMUNITY): Payer: Self-pay

## 2024-03-27 ENCOUNTER — Other Ambulatory Visit: Payer: Self-pay

## 2024-04-08 ENCOUNTER — Other Ambulatory Visit: Payer: Self-pay

## 2024-04-08 ENCOUNTER — Other Ambulatory Visit (HOSPITAL_COMMUNITY): Payer: Self-pay

## 2024-04-10 ENCOUNTER — Other Ambulatory Visit (HOSPITAL_COMMUNITY): Payer: Self-pay

## 2024-04-27 ENCOUNTER — Other Ambulatory Visit (HOSPITAL_COMMUNITY): Payer: Self-pay
